# Patient Record
Sex: Female | Born: 1960
Health system: Southern US, Community
[De-identification: ages and names within clinical notes are randomized; demographics above are authoritative.]

## PROBLEM LIST (undated history)

## (undated) DIAGNOSIS — E039 Hypothyroidism, unspecified: Secondary | ICD-10-CM

## (undated) DIAGNOSIS — I10 Essential (primary) hypertension: Secondary | ICD-10-CM

## (undated) DIAGNOSIS — T7840XA Allergy, unspecified, initial encounter: Secondary | ICD-10-CM

## (undated) DIAGNOSIS — F419 Anxiety disorder, unspecified: Secondary | ICD-10-CM

## (undated) DIAGNOSIS — K219 Gastro-esophageal reflux disease without esophagitis: Secondary | ICD-10-CM

## (undated) DIAGNOSIS — M1990 Unspecified osteoarthritis, unspecified site: Secondary | ICD-10-CM

## (undated) DIAGNOSIS — G4730 Sleep apnea, unspecified: Secondary | ICD-10-CM

## (undated) HISTORY — DX: Essential (primary) hypertension: I10

## (undated) HISTORY — DX: Gastro-esophageal reflux disease without esophagitis: K21.9

## (undated) HISTORY — DX: Hypothyroidism, unspecified: E03.9

## (undated) HISTORY — DX: Unspecified osteoarthritis, unspecified site: M19.90

## (undated) HISTORY — DX: Sleep apnea, unspecified: G47.30

## (undated) HISTORY — DX: Anxiety disorder, unspecified: F41.9

## (undated) HISTORY — DX: Allergy, unspecified, initial encounter: T78.40XA

---

## 1998-03-21 ENCOUNTER — Other Ambulatory Visit: Admission: RE | Admit: 1998-03-21 | Discharge: 1998-03-21 | Payer: Self-pay | Admitting: Obstetrics and Gynecology

## 1998-04-09 ENCOUNTER — Encounter: Payer: Self-pay | Admitting: Obstetrics and Gynecology

## 1998-04-18 ENCOUNTER — Inpatient Hospital Stay (HOSPITAL_COMMUNITY): Admission: RE | Admit: 1998-04-18 | Discharge: 1998-04-20 | Payer: Self-pay | Admitting: Obstetrics and Gynecology

## 1999-01-20 HISTORY — PX: ABDOMINAL HYSTERECTOMY: SHX81

## 2000-05-06 ENCOUNTER — Other Ambulatory Visit: Admission: RE | Admit: 2000-05-06 | Discharge: 2000-05-06 | Payer: Self-pay | Admitting: Obstetrics and Gynecology

## 2002-06-12 ENCOUNTER — Other Ambulatory Visit: Admission: RE | Admit: 2002-06-12 | Discharge: 2002-06-12 | Payer: Self-pay | Admitting: Obstetrics and Gynecology

## 2003-07-24 ENCOUNTER — Other Ambulatory Visit: Admission: RE | Admit: 2003-07-24 | Discharge: 2003-07-24 | Payer: Self-pay | Admitting: Obstetrics and Gynecology

## 2005-08-06 ENCOUNTER — Encounter (INDEPENDENT_AMBULATORY_CARE_PROVIDER_SITE_OTHER): Payer: Self-pay | Admitting: Specialist

## 2005-08-06 ENCOUNTER — Ambulatory Visit (HOSPITAL_BASED_OUTPATIENT_CLINIC_OR_DEPARTMENT_OTHER): Admission: RE | Admit: 2005-08-06 | Discharge: 2005-08-06 | Payer: Self-pay | Admitting: General Surgery

## 2007-01-20 HISTORY — PX: BLADDER REPAIR: SHX76

## 2007-12-30 ENCOUNTER — Encounter: Admission: RE | Admit: 2007-12-30 | Discharge: 2007-12-30 | Payer: Self-pay | Admitting: Obstetrics and Gynecology

## 2008-01-02 ENCOUNTER — Encounter: Payer: Self-pay | Admitting: Internal Medicine

## 2008-01-02 LAB — CONVERTED CEMR LAB

## 2008-03-19 ENCOUNTER — Ambulatory Visit (HOSPITAL_COMMUNITY): Admission: RE | Admit: 2008-03-19 | Discharge: 2008-03-19 | Payer: Self-pay | Admitting: Obstetrics and Gynecology

## 2009-04-11 ENCOUNTER — Encounter: Payer: Self-pay | Admitting: Internal Medicine

## 2009-05-19 LAB — HM MAMMOGRAPHY: HM Mammogram: NORMAL

## 2009-05-19 LAB — CONVERTED CEMR LAB: Pap Smear: NORMAL

## 2009-07-18 ENCOUNTER — Encounter: Payer: Self-pay | Admitting: Internal Medicine

## 2009-08-01 ENCOUNTER — Encounter: Payer: Self-pay | Admitting: Internal Medicine

## 2009-08-01 DIAGNOSIS — I10 Essential (primary) hypertension: Secondary | ICD-10-CM | POA: Insufficient documentation

## 2009-08-01 DIAGNOSIS — E7800 Pure hypercholesterolemia, unspecified: Secondary | ICD-10-CM | POA: Insufficient documentation

## 2009-08-01 LAB — CONVERTED CEMR LAB
ALT: 17 units/L
AST: 15 units/L
Albumin: 4.6 g/dL
Alkaline Phosphatase: 80 units/L
BUN: 12 mg/dL
CO2: 22 meq/L
Calcium: 9.9 mg/dL
Cholesterol: 242 mg/dL
Creatinine, Ser: 0.93 mg/dL
Glucose, Urine, Semiquant: 102
HDL: 43 mg/dL
LDL Cholesterol: 161 mg/dL
Potassium: 4.3 meq/L
Sodium: 140 meq/L
TSH: 7.223 microintl units/mL
Total Bilirubin: 0.4 mg/dL
Total Protein: 7.3 g/dL
Triglyceride fasting, serum: 188 mg/dL

## 2009-08-07 ENCOUNTER — Ambulatory Visit: Payer: Self-pay | Admitting: Internal Medicine

## 2009-08-07 DIAGNOSIS — H16009 Unspecified corneal ulcer, unspecified eye: Secondary | ICD-10-CM | POA: Insufficient documentation

## 2009-09-02 ENCOUNTER — Telehealth: Payer: Self-pay | Admitting: Internal Medicine

## 2009-09-05 ENCOUNTER — Ambulatory Visit: Payer: Self-pay | Admitting: Internal Medicine

## 2009-10-23 ENCOUNTER — Ambulatory Visit: Payer: Self-pay | Admitting: Internal Medicine

## 2009-10-23 ENCOUNTER — Telehealth: Payer: Self-pay | Admitting: Internal Medicine

## 2010-01-08 ENCOUNTER — Ambulatory Visit: Payer: Self-pay | Admitting: Internal Medicine

## 2010-01-08 DIAGNOSIS — R519 Headache, unspecified: Secondary | ICD-10-CM | POA: Insufficient documentation

## 2010-01-08 DIAGNOSIS — E039 Hypothyroidism, unspecified: Secondary | ICD-10-CM | POA: Insufficient documentation

## 2010-01-08 DIAGNOSIS — R51 Headache: Secondary | ICD-10-CM | POA: Insufficient documentation

## 2010-01-08 LAB — CONVERTED CEMR LAB
ALT: 17 units/L (ref 0–35)
AST: 16 units/L (ref 0–37)
Albumin: 4.5 g/dL (ref 3.5–5.2)
Alkaline Phosphatase: 82 units/L (ref 39–117)
BUN: 11 mg/dL (ref 6–23)
Basophils Absolute: 0.1 10*3/uL (ref 0.0–0.1)
Basophils Relative: 1.1 % (ref 0.0–3.0)
Bilirubin, Direct: 0.1 mg/dL (ref 0.0–0.3)
CO2: 29 meq/L (ref 19–32)
CRP, High Sensitivity: 5.51 — ABNORMAL HIGH (ref 0.00–5.00)
Calcium: 9.9 mg/dL (ref 8.4–10.5)
Chloride: 99 meq/L (ref 96–112)
Creatinine, Ser: 0.8 mg/dL (ref 0.4–1.2)
Eosinophils Absolute: 0.2 10*3/uL (ref 0.0–0.7)
Eosinophils Relative: 1.8 % (ref 0.0–5.0)
GFR calc non Af Amer: 78.06 mL/min (ref >60.00)
Glucose, Bld: 80 mg/dL (ref 70–99)
HCT: 44.5 % (ref 36.0–46.0)
Hemoglobin: 15.4 g/dL — ABNORMAL HIGH (ref 12.0–15.0)
Lymphocytes Relative: 27.4 % (ref 12.0–46.0)
Lymphs Abs: 2.6 10*3/uL (ref 0.7–4.0)
MCHC: 34.5 g/dL (ref 30.0–36.0)
MCV: 90.1 fL (ref 78.0–100.0)
Monocytes Absolute: 0.7 10*3/uL (ref 0.1–1.0)
Monocytes Relative: 6.9 % (ref 3.0–12.0)
Neutro Abs: 6 10*3/uL (ref 1.4–7.7)
Neutrophils Relative %: 62.8 % (ref 43.0–77.0)
Platelets: 385 10*3/uL (ref 150.0–400.0)
Potassium: 3.8 meq/L (ref 3.5–5.1)
RBC: 4.95 M/uL (ref 3.87–5.11)
RDW: 13.1 % (ref 11.5–14.6)
Sed Rate: 10 mm/hr (ref 0–22)
Sodium: 138 meq/L (ref 135–145)
TSH: 5.17 microintl units/mL (ref 0.35–5.50)
Total Bilirubin: 0.6 mg/dL (ref 0.3–1.2)
Total Protein: 7.6 g/dL (ref 6.0–8.3)
WBC: 9.5 10*3/uL (ref 4.5–10.5)

## 2010-02-18 NOTE — Assessment & Plan Note (Signed)
Summary: 2 ND MMR Cynthia Craig Natale Milch  Nurse Visit   Allergies: 1)  ! Codeine  Immunizations Administered:  MMR Vaccine # 2:    Vaccine Type: MMR    Site: right deltoid    Mfr: Merck    Dose: 0.5 ml    Route: IM    Given by: Ami Bullins CMA    Exp. Date: 05/05/2010    Lot #: [id number]    VIS given: 04/01/06 version given October 23, 2009.  Orders Added: 1)  MMR Vaccine SQ [90707] 2)  Admin 1st Vaccine [[address]]

## 2010-02-18 NOTE — Progress Notes (Signed)
Summary: MMR Vaccine  Phone Note Call from Patient   Summary of Call: Pt left vm on triage. She need MMR series for school. Left vm to confirm with the school whether she needs titer (labs to confirm childhood vaccination) or vaccine at office. We have the MMR in stock and are able to give at nurse visit.  Initial call taken by: Lamar Sprinkles, CMA,  September 02, 2009 3:17 PM

## 2010-02-18 NOTE — Assessment & Plan Note (Signed)
Summary: MMR/ TLJ Cynthia Craig  Nurse Visit   Allergies: 1)  ! Codeine  Immunizations Administered:  MMR Vaccine # 1:    Vaccine Type: MMR    Site: left deltoid    Mfr: Merck    Dose: 0.5 ml    Route: IM    Given by: Lanier Prude, CMA(AAMA)    Exp. Date: 01/10/2011    Lot #: [id number]    VIS given: 04/01/06 version given September 05, 2009.  Orders Added: 1)  MMR Vaccine SQ [90707] 2)  Admin 1st Vaccine [04540]

## 2010-02-18 NOTE — Assessment & Plan Note (Signed)
Summary: new / bcbs / # cd   Vital Signs:  Patient profile:   50 year old female Height:      65 inches Weight:      190 pounds BMI:     31.73 O2 Sat:      97 % on Room air Temp:     98.8 degrees F oral Pulse rate:   96 / minute Pulse rhythm:   regular Resp:     16 per minute BP sitting:   122 / 86  (left arm) Cuff size:   large  Vitals Entered By: Rock Nephew CMA (August 07, 2009 3:03 PM)  Nutrition Counseling: Patient's BMI is greater than 25 and therefore counseled on weight management options.  O2 Flow:  Room air CC: New pt// discuss recent elevated BP, Red eye Is Patient Diabetic? No Pain Assessment Patient in pain? no        Primary Care Provider:  Etta Grandchild MD  CC:  New pt// discuss recent elevated BP and Red eye.  History of Present Illness:  Red Eye      This is a 50 year old woman who presents with Red eye.  The symptoms began 2 weeks ago.  The severity is described as mild.  The patient reports redness of the right eye, pain, photophobia, and discharge, but denies redness of the left eye, blurring of vision, loss of vision, swelling, foreign body sensation, and itching.  Risk factors for red eye include contact lens usage.  The patient denies the following risk factors: foreign body in eye, history of glaucoma, FH of glaucoma, recent chickenpox infection, prior chicken pox infection, recent eye surgery, PMH of inflammatory bowel disease, and PMH sarcoidosis.    Preventive Screening-Counseling & Management  Alcohol-Tobacco     Alcohol drinks/day: <1     Alcohol type: all     >5/day in last 3 mos: no     Alcohol Counseling: not indicated; use of alcohol is not excessive or problematic     Feels need to cut down: no     Feels annoyed by complaints: no     Feels guilty re: drinking: no     Needs 'eye opener' in am: no     Smoking Status: never  Caffeine-Diet-Exercise     Does Patient Exercise: yes     Type of exercise:  swimming  Hep-HIV-STD-Contraception     Hepatitis Risk: no risk noted     HIV Risk: no risk noted     STD Risk: no risk noted     SBE monthly: yes     SBE Education/Counseling: to perform regular SBE      Sexual History:  currently monogamous.        Drug Use:  never.        Blood Transfusions:  no.    Clinical Review Panels:  Prevention   Last Mammogram:  normal (05/19/2009)   Last Pap Smear:  normal (05/19/2009)  Lipid Management   Cholesterol:  242 (08/01/2009)   LDL (bad choesterol):  161 (08/01/2009)   HDL (good cholesterol):  43 (08/01/2009)   Triglycerides:  188 (08/01/2009)  Diabetes Management   Creatinine:  0.93 (08/01/2009)  Complete Metabolic Panel   Sodium:  140 (08/01/2009)   Potassium:  4.3 (08/01/2009)   CO2:  22 (08/01/2009)   BUN:  12 (08/01/2009)   Creatinine:  0.93 (08/01/2009)   Albumin:  4.6 (08/01/2009)   Total Protein:  7.3 (08/01/2009)   Calcium:  9.9 (08/01/2009)   Total Bili:  0.4 (08/01/2009)   Alk Phos:  80 (08/01/2009)   SGPT (ALT):  17 (08/01/2009)   SGOT (AST):  15 (08/01/2009)   Medications Prior to Update: 1)  Hydrochlorothiazide 25 Mg Tabs (Hydrochlorothiazide) 2)  Lipitor 10 Mg Tabs (Atorvastatin Calcium) .... 1/2 Once Daily  Current Medications (verified): 1)  Hydrochlorothiazide 25 Mg Tabs (Hydrochlorothiazide) .... One By Mouth Once Daily 2)  Lisinopril 5 Mg Tabs (Lisinopril) .... Take 1 Tablet By Mouth Once A Day 3)  Levothyroxine Sodium 50 Mcg Tabs (Levothyroxine Sodium) .... Take 1 Tablet By Mouth Once A Day 4)  Ciprofloxacin Hcl 0.3 % Soln (Ciprofloxacin Hcl) .... 2 Gtts Into Right Eye Qid  Allergies (verified): 1)  ! Codeine  Past History:  Past Medical History: Hypertension Hypothyroidism  Past Surgical History: Reviewed history from 08/01/2009 and no changes required. Hysterectomy- 2001 Caesarean section- 1995 Midurethral Sling- 2010  Family History: Reviewed history from 08/01/2009 and no changes  required. Family History of Asthma Family History Hypertension Family History Kidney disease Family History Thyroid disease Family History Diabetes  Social History: Reviewed history from 08/01/2009 and no changes required. 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  The patient denies chest pain, syncope, dyspnea on exertion, peripheral edema, prolonged cough, headaches, hemoptysis, abdominal pain, and suspicious skin lesions.    Physical Exam  General:  alert, well-developed, well-nourished, well-hydrated, appropriate dress, and overweight-appearing.   Eyes:  right conjunctival injection and excessive tearing with 1 small central ulcer over the corneal epithelium with no stellate lesions. left eye is normal. Ears:  R ear normal and L ear normal.   Mouth:  Oral mucosa and oropharynx without lesions or exudates.  Teeth in good repair. Neck:  supple, full ROM, no masses, no thyromegaly, no JVD, normal carotid upstroke, no carotid bruits, no cervical lymphadenopathy, and no neck tenderness.   Lungs:  normal respiratory effort, no intercostal retractions, no accessory muscle use, normal breath sounds, no dullness, no fremitus, no crackles, and no wheezes.   Heart:  normal rate, regular rhythm, no murmur, no gallop, no rub, no JVD, and no HJR.   Abdomen:  soft, non-tender, normal bowel sounds, no distention, no masses, no guarding, no rigidity, no rebound tenderness, no abdominal hernia, no inguinal hernia, no hepatomegaly, and no splenomegaly.   Msk:  normal ROM, no joint tenderness, no joint swelling, no joint warmth, no redness over joints, and no  joint deformities.   Pulses:  R and L carotid,radial,femoral,dorsalis pedis and posterior tibial pulses are full and equal bilaterally Extremities:  No clubbing, cyanosis, edema, or deformity noted with normal full range of motion of all joints.   Neurologic:  No cranial nerve deficits noted. Station and gait are normal. Plantar reflexes are down-going bilaterally. DTRs are symmetrical throughout. Sensory, motor and coordinative functions appear intact. Skin:  turgor normal, color normal, no rashes, no suspicious lesions, no ecchymoses, no petechiae, no purpura, no ulcerations, and no edema.   Cervical Nodes:  No lymphadenopathy noted Axillary Nodes:  No palpable lymphadenopathy Inguinal Nodes:  No significant adenopathy Psych:  Cognition and judgment appear intact. Alert  and cooperative with normal attention span and concentration. No apparent delusions, illusions, hallucinations   Impression & Recommendations:  Problem # 1:  UNSPECIFIED CORNEAL ULCER (ICD-370.00) Assessment New this is most likely staph from contact lens usage so I will start ciloxan drops  Problem # 2:  HYPERTENSION (ICD-401.9) Assessment: Improved  Her updated medication list for this problem includes:    Hydrochlorothiazide 25 Mg Tabs (Hydrochlorothiazide) ..... One by mouth once daily    Lisinopril 5 Mg Tabs (Lisinopril) .Marland Kitchen... Take 1 tablet by mouth once a day  BP today: 122/86  Prior 10 Yr Risk Heart Disease: Not enough information (08/01/2009)  Labs Reviewed: K+: 4.3 (08/01/2009) Creat: : 0.93 (08/01/2009)   Chol: 242 (08/01/2009)   HDL: 43 (08/01/2009)   LDL: 161 (08/01/2009)   TG: 188 (08/01/2009)  Complete Medication List: 1)  Hydrochlorothiazide 25 Mg Tabs (Hydrochlorothiazide) .... One by mouth once daily 2)  Lisinopril 5 Mg Tabs (Lisinopril) .... Take 1 tablet by mouth once a day 3)  Levothyroxine Sodium 50 Mcg Tabs (Levothyroxine sodium) .... Take 1 tablet by mouth once a day 4)  Ciprofloxacin Hcl  0.3 % Soln (Ciprofloxacin hcl) .... 2 gtts into right eye qid  Patient Instructions: 1)  Please schedule a follow-up appointment in 3 months. 2)  It is important that you exercise regularly at least 20 minutes 5 times a week. If you develop chest pain, have severe difficulty breathing, or feel very tired , stop exercising immediately and seek medical attention. 3)  You need to lose weight. Consider a lower calorie diet and regular exercise.  4)  Check your Blood Pressure regularly. If it is above 140/90: you should make an appointment. 5)  Clean any discharge from eyelids with baby shampoo and warm water. Be sure to wash your hands often  to avoid spreading and reinfection. If you wear contacts, remove them and wear glasses until infection resolved (be sure and clean lenses before replacing).  Prescriptions: LEVOTHYROXINE SODIUM 50 MCG TABS (LEVOTHYROXINE SODIUM) Take 1 tablet by mouth once a day  #30 x 11   Entered and Authorized by:   Etta Grandchild MD   Signed by:   Etta Grandchild MD on 08/07/2009   Method used:   Electronically to        [facility] # [phone]* (retail)       [address]       [place], [place]  [address]       Ph: [phone]       Fax: [phone]   RxID:   [id number] LISINOPRIL 5 MG TABS (LISINOPRIL) Take 1 tablet by mouth once a day  #30 x 11   Entered and Authorized by:   Etta Grandchild MD   Signed by:   Etta Grandchild MD on 08/07/2009   Method used:   Electronically to        [facility] # [phone]* (retail)       [address]       [place], [place]  [address]       Ph: [phone]       Fax: [phone]   RxID:   [id number] HYDROCHLOROTHIAZIDE 25 MG TABS (HYDROCHLOROTHIAZIDE) One by mouth once daily  #30 x 11   Entered and Authorized by:   Etta Grandchild MD   Signed by:   Etta Grandchild MD on 08/07/2009   Method used:   Electronically to        [facility] # [phone]* (retail)       [address]       [place], [place]   [address]       Ph: [phone]       Fax: [phone]   RxID:   [id number] CIPROFLOXACIN HCL 0.3 % SOLN (CIPROFLOXACIN HCL) 2 gtts into right eye QID  #1 bottle x 0   Entered and Authorized by:   Etta Grandchild MD   Signed by:   Etta Grandchild MD on 08/07/2009   Method used:   Electronically to        [facility] # [phone]* (retail)       [address]       [place], [place]  [address]       Ph: [phone]       Fax: [phone]   RxID:   [id number]   Preventive Care Screening  Mammogram:    Date:  05/19/2009    Results:  normal   Pap Smear:    Date:  05/19/2009    Results:  normal   Last Tetanus Booster:    Date:  01/20/2003    Results:  Historical

## 2010-02-18 NOTE — Letter (Signed)
[facility]   Imported By: Sherian Rein 08/09/2009 11:10:33  _____________________________________________________________________  External Attachment:    Type:   Image     Comment:   External Document

## 2010-02-18 NOTE — Letter (Signed)
Summary: [facility]   Imported By: Sherian Rein 08/09/2009 11:11:34  _____________________________________________________________________  External Attachment:    Type:   Image     Comment:   External Document

## 2010-02-20 NOTE — Assessment & Plan Note (Signed)
Summary: HEADACHES/NWS   Vital Signs:  Patient profile:   50 year old female Menstrual status:  hysterectomy Height:      65 inches Weight:      190 pounds BMI:     31.73 O2 Sat:      96 % on Room air Temp:     98.3 degrees F oral Pulse rate:   93 / minute Pulse rhythm:   regular Resp:     16 per minute BP sitting:   140 / 92  (left arm) Cuff size:   large  Vitals Entered By: Rock Nephew CMA (January 08, 2010 1:17 PM)  Nutrition Counseling: Patient's BMI is greater than 25 and therefore counseled on weight management options.  O2 Flow:  Room air CC: Patient c/o headache with dizziness , Headaches, Headache Pain Assessment Patient in pain? yes     Location: head  Does patient need assistance? Functional Status Self care Ambulation Normal     Menstrual Status hysterectomy Last PAP Result normal   Primary Care Provider:  Etta Grandchild MD  CC:  Patient c/o headache with dizziness , Headaches, and Headache.  History of Present Illness:  Headaches      This is a 50 year old woman who presents with Headaches.  The symptoms began 4-8 weeks ago.  On a scale of 1 to 10, the intensity is described as a 2.  The patient denies nausea, vomiting, sweats, tearing of eyes, nasal congestion, sinus pain, sinus pressure, photophobia, and phonophobia.  The headache is described as intermittent and sharp.  The location of the pain is occipital.  High-risk features (red flags) include new type of headache.  The patient denies the following high-risk features: fever, neck pain/stiffness, vision loss or change, focal weakness, altered mental status, rash, trauma, pain worse with exertion, age >50 years, immunosuppression, concomitant infection, and anticoagulation use.  The headaches are precipitated by stress.  Prior treatment has included a NSAID.    Headache HPI:      Duration of headaches: 4-8 weeks.        On a scale of 1-10, she describes the headache as a 2.  The location of the  headaches are occipital.  Headache quality is intermittent and sharp.         Preventive Screening-Counseling & Management  Alcohol-Tobacco     Alcohol drinks/day: <1     Alcohol type: all     >5/day in last 3 mos: no     Alcohol Counseling: not indicated; use of alcohol is not excessive or problematic     Feels need to cut down: no     Feels annoyed by complaints: no     Feels guilty re: drinking: no     Needs 'eye opener' in am: no     Smoking Status: never     Tobacco Counseling: not indicated; no tobacco use  Hep-HIV-STD-Contraception     Hepatitis Risk: no risk noted     HIV Risk: no risk noted     STD Risk: no risk noted     SBE monthly: yes     SBE Education/Counseling: to perform regular SBE      Sexual History:  currently monogamous.        Drug Use:  never.        Blood Transfusions:  no.    Current Medications (verified): 1)  Hydrochlorothiazide 25 Mg Tabs (Hydrochlorothiazide) .... One By Mouth Once Daily 2)  Lisinopril 5 Mg Tabs (Lisinopril) .Marland KitchenMarland KitchenMarland Kitchen  Take 1 Tablet By Mouth Once A Day 3)  Levothyroxine Sodium 50 Mcg Tabs (Levothyroxine Sodium) .... Take 1 Tablet By Mouth Once A Day 4)  Ciprofloxacin Hcl 0.3 % Soln (Ciprofloxacin Hcl) .... 2 Gtts Into Right Eye Qid  Allergies (verified): 1)  ! Codeine  Past History:  Past Medical History: Last updated: 08/07/2009 Hypertension Hypothyroidism  Past Surgical History: Last updated: 08/01/2009 Hysterectomy- 2001 Caesarean section- 1995 Midurethral Sling- 2010  Family History: Last updated: 08/01/2009 Family History of Asthma Family History Hypertension Family History Kidney disease Family History Thyroid disease Family History Diabetes  Social History: Last updated: 01/08/2010 Married Drug use-no Occupation: student Never Smoked Alcohol use-yes Regular exercise-yes  Risk Factors: Alcohol Use: <1 (01/08/2010) >5 drinks/d w/in last 3 months: no (01/08/2010) Exercise: yes (08/07/2009)  Risk  Factors: Smoking Status: never (01/08/2010)  Family History: Reviewed history from 08/01/2009 and no changes required. Family History of Asthma Family History Hypertension Family History Kidney disease Family History Thyroid disease Family History Diabetes  Social History: Reviewed history from 08/07/2009 and no changes required. 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  The patient denies anorexia, fever, weight loss, weight gain, vision loss, decreased hearing, hoarseness, chest pain, syncope, dyspnea on exertion, peripheral edema, prolonged cough, hemoptysis, abdominal pain, melena, hematuria, muscle weakness, suspicious skin lesions, transient blindness, difficulty walking, depression, unusual weight change, and abnormal bleeding.   Neuro:  Complains of headaches; denies brief paralysis, difficulty with concentration, disturbances in coordination, falling down, inability to speak, memory loss, numbness, poor balance, seizures, sensation of room spinning, tingling, tremors, visual disturbances, and weakness. Endo:  Denies cold intolerance, excessive hunger, excessive thirst, excessive urination, heat intolerance, polyuria, and weight change.  Physical Exam  General:  alert, well-developed, well-nourished, well-hydrated, appropriate dress, normal appearance, healthy-appearing, cooperative to examination, and good hygiene.   Head:  normocephalic, atraumatic, no abnormalities observed, no abnormalities palpated, and no alopecia.   Eyes:  vision grossly intact, pupils equal, pupils round, pupils reactive to light, pupils react to accomodation, no injection, no optic disk abnormalities, no retinal abnormalitiies, and no nystagmus.   Ears:  R ear normal and L ear normal.   Mouth:  Oral mucosa and oropharynx without lesions or exudates.  Teeth in good repair. Neck:  supple, full  ROM, and no masses.   Lungs:  normal respiratory effort, no intercostal retractions, no accessory muscle use, normal breath sounds, no dullness, no fremitus, no crackles, and no wheezes.   Heart:  normal rate, regular rhythm, no murmur, no gallop, no rub, and no JVD.   Abdomen:  soft, non-tender, normal bowel sounds, no distention, no masses, no guarding, no rigidity, no rebound tenderness, no abdominal hernia, no inguinal hernia, no hepatomegaly, and no splenomegaly.   Msk:  normal ROM, no joint tenderness, no joint swelling, no joint warmth, no redness over joints, no joint deformities, no joint instability, and no crepitation.   Pulses:  R and L carotid,radial,femoral,dorsalis pedis and posterior tibial pulses are full and equal bilaterally Extremities:  No clubbing, cyanosis, edema, or deformity noted with normal full range of motion of all joints.   Neurologic:  alert & oriented X3, cranial nerves II-XII intact, strength normal in all extremities, sensation intact to light touch, sensation intact to pinprick, gait normal, DTRs symmetrical and normal, finger-to-nose normal, heel-to-shin normal, toes down bilaterally on Babinski, and Romberg negative.     Impression &  Recommendations:  Problem # 1:  HYPOTHYROIDISM (ICD-244.9) Assessment Unchanged  Her updated medication list for this problem includes:    Levothyroxine Sodium 50 Mcg Tabs (Levothyroxine sodium) .Marland Kitchen... Take 1 tablet by mouth once a day  Labs Reviewed: TSH: 7.223 (08/01/2009)    Chol: 242 (08/01/2009)   HDL: 43 (08/01/2009)   LDL: 161 (08/01/2009)   TG: 188 (08/01/2009)  Problem # 2:  HEADACHE (ICD-784.0) Assessment: New this sounds like a tension-contraction type of headache, will check for vasculitis and other systemic illness. I may order a brain scan pending her response to Robaxin. Orders: Venipuncture ([address]) TLB-BMP (Basic Metabolic Panel-BMET) (80048-METABOL) TLB-CBC Platelet - w/Differential  (85025-CBCD) TLB-Hepatic/Liver Function Pnl (80076-HEPATIC) TLB-TSH (Thyroid Stimulating Hormone) (84443-TSH) TLB-CRP-High Sensitivity (C-Reactive Protein) (86140-FCRP) TLB-Sedimentation Rate (ESR) (85652-ESR)  Problem # 3:  HYPERTENSION (ICD-401.9) Assessment: Unchanged  Her updated medication list for this problem includes:    Hydrochlorothiazide 25 Mg Tabs (Hydrochlorothiazide) ..... One by mouth once daily    Lisinopril 5 Mg Tabs (Lisinopril) .Marland Kitchen... Take 1 tablet by mouth once a day  Orders: Venipuncture ([address]) TLB-BMP (Basic Metabolic Panel-BMET) (80048-METABOL) TLB-CBC Platelet - w/Differential (85025-CBCD) TLB-Hepatic/Liver Function Pnl (80076-HEPATIC) TLB-TSH (Thyroid Stimulating Hormone) (84443-TSH) TLB-CRP-High Sensitivity (C-Reactive Protein) (86140-FCRP) TLB-Sedimentation Rate (ESR) (85652-ESR)  BP today: 140/92 Prior BP: 122/86 (08/07/2009)  Prior 10 Yr Risk Heart Disease: Not enough information (08/01/2009)  Labs Reviewed: K+: 4.3 (08/01/2009) Creat: : 0.93 (08/01/2009)   Chol: 242 (08/01/2009)   HDL: 43 (08/01/2009)   LDL: 161 (08/01/2009)   TG: 188 (08/01/2009)  Complete Medication List: 1)  Hydrochlorothiazide 25 Mg Tabs (Hydrochlorothiazide) .... One by mouth once daily 2)  Lisinopril 5 Mg Tabs (Lisinopril) .... Take 1 tablet by mouth once a day 3)  Levothyroxine Sodium 50 Mcg Tabs (Levothyroxine sodium) .... Take 1 tablet by mouth once a day 4)  Methocarbamol 500 Mg Tabs (Methocarbamol) .... One by mouth three times a day as needed for tension headache  Patient Instructions: 1)  Please schedule a follow-up appointment in 2 weeks. 2)  It is important that you exercise regularly at least 20 minutes 5 times a week. If you develop chest pain, have severe difficulty breathing, or feel very tired , stop exercising immediately and seek medical attention. 3)  You need to lose weight. Consider a lower calorie diet and regular exercise.  4)  Check your Blood Pressure  regularly. If it is above 130/80: you should make an appointment. Prescriptions: METHOCARBAMOL 500 MG TABS (METHOCARBAMOL) One by mouth three times a day as needed for tension headache  #50 x 2   Entered and Authorized by:   Etta Grandchild MD   Signed by:   Etta Grandchild MD on 01/08/2010   Method used:   Electronically to        [facility] # [phone]* (retail)       [address]       [place], [place]  [address]       Ph: [phone]       Fax: [phone]   RxID:   [id number]    Orders Added: 1)  Venipuncture [66440] 2)  Est. Patient Level IV [34742] 3)  TLB-BMP (Basic Metabolic Panel-BMET) [80048-METABOL] 4)  TLB-CBC Platelet - w/Differential [85025-CBCD] 5)  TLB-Hepatic/Liver Function Pnl [80076-HEPATIC] 6)  TLB-TSH (Thyroid Stimulating Hormone) [84443-TSH] 7)  TLB-CRP-High Sensitivity (C-Reactive Protein) [86140-FCRP] 8)  TLB-Sedimentation Rate (ESR) [59563-OVF]

## 2010-04-30 ENCOUNTER — Ambulatory Visit (INDEPENDENT_AMBULATORY_CARE_PROVIDER_SITE_OTHER)
Admission: RE | Admit: 2010-04-30 | Discharge: 2010-04-30 | Disposition: A | Discharge status: Home / Self Care | Payer: 59 | Source: Ambulatory Visit | Attending: Internal Medicine | Admitting: Internal Medicine

## 2010-04-30 ENCOUNTER — Ambulatory Visit (INDEPENDENT_AMBULATORY_CARE_PROVIDER_SITE_OTHER): Payer: 59 | Admitting: Internal Medicine

## 2010-04-30 ENCOUNTER — Encounter: Payer: Self-pay | Admitting: Internal Medicine

## 2010-04-30 VITALS — BP 138/88 | HR 90 | Temp 98.3°F | Resp 14 | Ht 65.0 in | Wt 184.8 lb

## 2010-04-30 DIAGNOSIS — M25561 Pain in right knee: Secondary | ICD-10-CM

## 2010-04-30 DIAGNOSIS — IMO0002 Reserved for concepts with insufficient information to code with codable children: Secondary | ICD-10-CM

## 2010-04-30 DIAGNOSIS — M25569 Pain in unspecified knee: Secondary | ICD-10-CM

## 2010-04-30 DIAGNOSIS — M179 Osteoarthritis of knee, unspecified: Secondary | ICD-10-CM | POA: Insufficient documentation

## 2010-04-30 DIAGNOSIS — M1710 Unilateral primary osteoarthritis, unspecified knee: Secondary | ICD-10-CM | POA: Insufficient documentation

## 2010-04-30 MED ORDER — IBUPROFEN 600 MG PO TABS: 600.0000 mg | ORAL_TABLET | Freq: Four times a day (QID) | ORAL | Status: DC | PRN

## 2010-04-30 NOTE — Patient Instructions (Signed)
Arthritis - Degenerative, Osteoarthritis You have osteoarthritis. This is the wear and tear arthritis that comes with aging. It is also called degenerative arthritis. This is common in people past middle age. It is caused by stress on the joints from living. The large weight bearing joints of the lower extremities are most often affected. The knees, hips, back, neck, and hands can become painful, swollen, and stiff. This is the most common type of arthritis. It comes on with age, carrying too much weight, and from injury. Treatment includes resting the sore joint until the pain and swelling improve. Crutches or a walker may be needed for severe flares. Only take over-the-counter or prescription medicines for pain, discomfort, or fever as directed by your caregiver. Local heat therapy may improve motion. Cortisone shots into the joint are sometimes used to reduce pain and swelling during flares. Osteoarthritis is usually not crippling and progresses slowly. There are things you can do to decrease pain:  Avoid high impact activities.   Exercise regularly.   Low impact exercises such as walking, biking and swimming help to keep the muscles strong and keep normal joint function.   Stretching helps to keep your range of motion.   Lose weight if you are overweight. This reduces joint stress.  In severe cases when you have pain at rest or increasing disability, joint surgery may be helpful. See your caregiver for follow-up treatment as recommended.  SEEK IMMEDIATE MEDICAL CARE IF:  You have severe joint pain.   Marked swelling and redness in your joint develops.   You develop a high fever.  Document Released: 01/05/2005 Document Re-Released: 05/07/2007 [employer] Patient Information 2011 [employer]. 

## 2010-04-30 NOTE — Progress Notes (Signed)
Subjective:    Cynthia Craig is a 50 y.o. female who presents with knee pain involving the right knee. Onset was gradual, starting about 2 weeks ago. Inciting event: none known. Current symptoms include: crepitus sensation, locking, stiffness and swelling. Pain is aggravated by any weight bearing and going up and down stairs. Patient has had no prior knee problems. Evaluation to date: none. Treatment to date: OTC analgesics which are somewhat effective.  The following portions of the patient's history were reviewed and updated as appropriate: allergies, current medications, past family history, past medical history, past social history, past surgical history and problem list.   Review of Systems Musculoskeletal:positive for knee pain, negative for arthralgias, back pain, muscle weakness, myalgias, neck pain and stiff joints   Objective:    BP 138/88  Pulse 90  Temp(Src) 98.3 F (36.8 C) (Oral)  Resp 14  Ht 5\' 5"  (1.651 m)  Wt 184 lb 12 oz (83.802 kg)  BMI 30.74 kg/m2  SpO2 96% Right knee: positive exam findings: effusion, crepitus, medial joint line tenderness and lateral joint line tenderness and negative exam findings: no erythema, ACL stable, PCL stable, MCL stable, LCL stable, no patellar laxity, McMurray's negative and FROM  Left knee:  normal and no effusion, full active range of motion, no joint line tenderness, ligamentous structures intact.   X-ray right knee: shows DJD changes, likely chronic    Assessment:    Right knee mild DJD   Plan:    Natural history and expected course discussed. Questions answered. Transport planner distributed. Rest, ice, compression, and elevation (RICE) therapy. Reduction in offending activity. Quad strengthening exercises. NSAIDs per medication orders. PT referral. Follow up in 1 month.

## 2010-05-06 LAB — BASIC METABOLIC PANEL
BUN: 9 mg/dL (ref 6–23)
Calcium: 9.3 mg/dL (ref 8.4–10.5)
Chloride: 100 mEq/L (ref 96–112)
Creatinine, Ser: 0.89 mg/dL (ref 0.4–1.2)
GFR calc Af Amer: >60 mL/min (ref >60)
GFR calc non Af Amer: >60 mL/min (ref >60)

## 2010-05-06 LAB — CBC
HCT: 41.9 % (ref 36.0–46.0)
MCHC: 33.6 g/dL (ref 30.0–36.0)
MCV: 90.2 fL (ref 78.0–100.0)
Platelets: 384 10*3/uL (ref 150–400)
RBC: 4.64 MIL/uL (ref 3.87–5.11)

## 2010-06-03 NOTE — H&P (Signed)
NAME:  Cynthia Craig, AYE NO.:  [id number]   MEDICAL RECORD NO.:  [id number]          PATIENT TYPE:  AMB   LOCATION:                                FACILITY:  [facility]   PHYSICIAN:  Juluis Mire, M.D.   DATE OF BIRTH:  1960-12-13   DATE OF ADMISSION:  03/19/2008  DATE OF DISCHARGE:                              HISTORY & PHYSICAL    The patient is a 50 year old gravida 2, para 1, abortus 1 female who  presents for mid-urethral sling.   In relation to the present admission, the patient is having worsening  problems with stress urinary incontinence.  She leaks during events such  as coughing, sneezing, and exercises.  This is becoming a consistent  problem and is somewhat limiting.  She underwent urodynamic testing in  the office.  She had a normal postvoid residual.  Her volumes were  fairly normal.  She did leak with coughing and Valsalva with normal leak-  point pressures.  She had normal urethral pressure profile.  Her voiding  pattern did not show an obstructive pattern.  There was no overactive  bladder activity noted.  She now presents for a mid-urethral sling,  other alternatives such as biofeedback and bladder retraining were  discussed.   In terms of allergies, she is allergic to CODEINE.   MEDICATIONS:  1. Cozaar were 100 mg daily.  2. Levoxyl 150 mcg daily.  3. Maxzide 1 daily.   PAST MEDICAL HISTORY:  Significant for history of hypertension, she is  on medications as noted.  Also history of hypothyroidism on Synthroid  replacement.   PREVIOUS SURGICAL HISTORY:  She has had a previous laparoscopic-assisted  vaginal hysterectomy in 2000.  She had a cesarean section.   SOCIAL HISTORY:  Reveals no tobacco or alcohol abuse.   FAMILY HISTORY:  Noncontributory.   REVIEW OF SYSTEMS:  Noncontributory.   PHYSICAL EXAMINATION:  VITAL SIGNS:  The patient is afebrile with stable  vital signs.  HEENT:  Head is normocephalic.  Pupils are equal, round, and  reactive to  light and accommodation.  Extraocular movements were intact.  Sclerae  and conjunctivae are clear.  Oropharynx is clear.  NECK:  Without thyromegaly.  BREASTS:  Not examined.  LUNGS:  Clear.  CARDIAC:  Regular rate without murmurs or gallops.  ABDOMEN:  Benign.  No mass, organomegaly, or tenderness.  PELVIC:  Normal external genitalia.  Vaginal mucosa is clear.  Cuff  intact.  She has a mild cystourethrocele.  Bimanual exam is  unremarkable.  No mass appreciated.  EXTREMITIES:  Trace edema.  NEUROLOGIC:  Grossly within normal limits.   IMPRESSION:  1. Anatomical stress urinary incontinence due to urethral      hypermobility.  2. Hypertension.  3. Hypothyroidism.   PLAN:  The patient will go for a transobturator midurethral sling.  Success rate of 8% are quoted.  The risk has been explained to the  patient, this would include the risk of over tightening.  This could  lead to inability to void.  This could require taking down the sling  with return of incontinence.  There is a risk of developing overactive  bladder activity that could require long-term medications.  Also the  risk of mesh erosion was explained this could require surgical  management.  Risk of injury to adjacent organs, this could include  bladder, urethra.  This could require further surgical management.  The  risk of operative nerve injury could lead to chronic leg pain and  weakness.  There is also the risk of hemorrhage that could require  transfusion with a risk of AIDS or hepatitis.  Risk of deep venous  thrombosis and pulmonary embolus.  The patient expressed understanding  of indications and risks.      Juluis Mire, M.D.  Electronically Signed     JSM/MEDQ  D:  03/19/2008  T:  03/19/2008  Job:  [id number]

## 2010-06-03 NOTE — Op Note (Signed)
Cynthia Craig, Cynthia Craig                ACCOUNT NO.:  [id number]   MEDICAL RECORD NO.:  [id number]          PATIENT TYPE:  AMB   LOCATION:  [facility]                           FACILITY:  [facility]   PHYSICIAN:  Juluis Mire, M.D.   DATE OF BIRTH:  Oct 13, 1960   DATE OF PROCEDURE:  03/19/2008  DATE OF DISCHARGE:                               OPERATIVE REPORT   PREOPERATIVE DIAGNOSIS:  Anatomical stress urinary incontinence due to  urethral hypermobility.   POSTOPERATIVE DIAGNOSIS:  Anatomical stress urinary incontinence due to  urethral hypermobility.   PROCEDURES:  1. Mid urethral sling using a transobturator approach.  2. Cystoscopy.   SURGEON:  Juluis Mire, MD   ANESTHESIA:  General.   ESTIMATED BLOOD LOSS:  200 mL.   PACKS AND DRAINS:  None.   INTRAOPERATIVE BLOOD PLACED:  None.   COMPLICATIONS:  None.   INDICATIONS:  Dictated in the history and physical.   PROCEDURE IN DETAIL:  The patient was taken to the OR and placed in  supine position.  After satisfactory level of general anesthesia was  obtained, the patient was placed in the dorsal lithotomy position using  the Allen stirrups.  The perineum and vagina cleansed out with Betadine  and draped in sterile field.  A weighted speculum was placed in the  vaginal vault.  Foley was put in place and clamped off.  The mid  urethral area was identified, injected with 1% Xylocaine with  epinephrine.  Incision was made in the vaginal mucosa over the mid  urethral area.  We dissected out laterally to the obturator foramen on  each side.  At this point in time, in the groin, we identified an area  at the level of clitoris below the abductus longus muscle and tendon.  Next to the inferior pubic ramus,  this area was infiltrated with 1%  Xylocaine with epinephrine.  A punch incision was made in the skin using  the halo system needles were passed through the skin on each side  through the obturator foramen around the inferior pubic ramus  and out  through the vaginal incision.  There was no buttonhole in the vagina  noted.  Cystoscopy was then performed.  There was no evidence of any  injury to the bladder.  Both ureteral orifices were identified, noted to  be spilling copious amounts of clear urine.  Urethra was also intact.  Next, the polypropylene mesh was put in place, attached the needles on  each side and brought out through the skin, it was adjusted around the  mid urethral area until lay flat, yet we could easily rotate a Kelly 90  degrees.  The plastic sheaths were then removed.  We reconfirmed proper  placement of the polypropylene mesh that was overly tightened.  The arms  were trimmed at the level of the skin in the groin and the groin  incisions were closed with Dermabond.  At this point in time, vaginal  mucosa was closed with running suture of 2-0 Vicryl.  Some oozing was  encountered and brought to control with figure-of-eight of  2-0 Vicryl.  At this point  in time, Foley was placed back straight drain.  Urine was clear and  adequate.  The patient was taken out of the dorsal position, once alert  and extubated, transferred to recovery room in good condition.  Sponge,  instrument, and needle count reported as correct by circulating nurse.      Juluis Mire, M.D.  Electronically Signed     JSM/MEDQ  D:  03/19/2008  T:  03/19/2008  Job:  [id number]

## 2010-06-06 NOTE — Op Note (Signed)
NAMETERRIYAH, WESTRA                ACCOUNT NO.:  [id number]   MEDICAL RECORD NO.:  [id number]          PATIENT TYPE:  AMB   LOCATION:  [facility]                          FACILITY:  [facility]   PHYSICIAN:  Leonie Man, M.D.   DATE OF BIRTH:  [birth year]   DATE OF PROCEDURE:  08/06/2005  DATE OF DISCHARGE:                                 OPERATIVE REPORT   PREOPERATIVE DIAGNOSIS:  Melanoma left flank.   POSTOPERATIVE DIAGNOSIS:  Melanoma left flank.   PROCEDURE:  Excision melanoma left flank.   SURGEON:  Leonie Man, M.D.   ASSISTANT:  None.   ANESTHESIA:  1% lidocaine with epinephrine.   NOTE:  Ms. Downie is a 50 year old female with a melanotic lesion which has  been excised by Dr. Campbell Stall.  The lesion has undetermined biologic  potential.  The patient comes to the operating room now for a wider excision  of this area after the risks and potential benefits of surgery have been  discussed, all questions answered and consent obtained.   PROCEDURE:  With the patient positioned with the left flank up on the  operating room table, the area of the previously excised melanoma was  prepped and draped to be included in a sterile operative field.  I outlined  an elliptical incision around this leaving approximately 2 mm margins around  the area.  This was then infiltrated with 1% lidocaine with epinephrine.  An  elliptical incision is carried down through the skin down through the  subcutaneous tissues.  The area is excised in its entirety.  The superior or  12 o'clock margin was marked with a nylon suture and this specimen was  forwarded for pathologic evaluation.  Hemostasis obtained with  electrocautery.  Subcutaneous tissues reapproximated with 3-0 Vicryl sutures  and the skin was closed with running 4-0 Monocryl suture and then reinforced  with Steri-Strips.  Sterile dressings applied.  Anesthetic reversed.  The  patient removed from the operating room to the recovery room in  stable  condition.  She tolerated the procedure well.      Leonie Man, M.D.  Electronically Signed     PB/MEDQ  D:  08/06/2005  T:  08/06/2005  Job:  [id number]

## 2010-08-05 ENCOUNTER — Ambulatory Visit: Payer: 59 | Admitting: *Deleted

## 2010-08-05 DIAGNOSIS — Z111 Encounter for screening for respiratory tuberculosis: Secondary | ICD-10-CM

## 2010-08-07 ENCOUNTER — Encounter: Payer: Self-pay | Admitting: Internal Medicine

## 2010-08-07 LAB — TB SKIN TEST: TB Skin Test: NEGATIVE mm

## 2010-11-14 ENCOUNTER — Encounter: Payer: Self-pay | Admitting: Internal Medicine

## 2010-11-14 ENCOUNTER — Ambulatory Visit (INDEPENDENT_AMBULATORY_CARE_PROVIDER_SITE_OTHER): Payer: 59 | Admitting: Internal Medicine

## 2010-11-14 ENCOUNTER — Other Ambulatory Visit (INDEPENDENT_AMBULATORY_CARE_PROVIDER_SITE_OTHER): Payer: 59

## 2010-11-14 VITALS — BP 172/100 | HR 90 | Temp 98.3°F | Resp 16 | Wt 194.0 lb

## 2010-11-14 DIAGNOSIS — I10 Essential (primary) hypertension: Secondary | ICD-10-CM

## 2010-11-14 DIAGNOSIS — R0989 Other specified symptoms and signs involving the circulatory and respiratory systems: Secondary | ICD-10-CM

## 2010-11-14 DIAGNOSIS — R0683 Snoring: Secondary | ICD-10-CM | POA: Insufficient documentation

## 2010-11-14 DIAGNOSIS — R0609 Other forms of dyspnea: Secondary | ICD-10-CM

## 2010-11-14 DIAGNOSIS — R51 Headache: Secondary | ICD-10-CM

## 2010-11-14 DIAGNOSIS — E039 Hypothyroidism, unspecified: Secondary | ICD-10-CM

## 2010-11-14 DIAGNOSIS — Z23 Encounter for immunization: Secondary | ICD-10-CM

## 2010-11-14 LAB — TSH: TSH: 5.71 u[IU]/mL — ABNORMAL HIGH (ref 0.35–5.50)

## 2010-11-14 LAB — CBC WITH DIFFERENTIAL/PLATELET
Basophils Absolute: 0 10*3/uL (ref 0.0–0.1)
Basophils Relative: 0.5 % (ref 0.0–3.0)
Eosinophils Absolute: 0.2 10*3/uL (ref 0.0–0.7)
Hemoglobin: 14.4 g/dL (ref 12.0–15.0)
Lymphocytes Relative: 27 % (ref 12.0–46.0)
MCHC: 34.3 g/dL (ref 30.0–36.0)
MCV: 88.9 fl (ref 78.0–100.0)
Monocytes Absolute: 0.4 10*3/uL (ref 0.1–1.0)
Neutro Abs: 5.6 10*3/uL (ref 1.4–7.7)
RDW: 13.2 % (ref 11.5–14.6)

## 2010-11-14 LAB — COMPREHENSIVE METABOLIC PANEL
AST: 18 U/L (ref 0–37)
Alkaline Phosphatase: 73 U/L (ref 39–117)
BUN: 11 mg/dL (ref 6–23)
Creatinine, Ser: 0.9 mg/dL (ref 0.4–1.2)
Glucose, Bld: 100 mg/dL — ABNORMAL HIGH (ref 70–99)
Potassium: 3.8 mEq/L (ref 3.5–5.1)
Total Bilirubin: 0.5 mg/dL (ref 0.3–1.2)

## 2010-11-14 LAB — SEDIMENTATION RATE: Sed Rate: 8 mm/hr (ref 0–22)

## 2010-11-14 MED ORDER — OLMESARTAN MEDOXOMIL 40 MG PO TABS: 40.0000 mg | ORAL_TABLET | Freq: Every day | ORAL | Status: DC

## 2010-11-14 MED ORDER — METOPROLOL-HCTZ ER 25-12.5 MG PO TB24: 1.0000 | ORAL_TABLET | Freq: Every day | ORAL | Status: DC

## 2010-11-14 NOTE — Assessment & Plan Note (Signed)
I will check her TSH today 

## 2010-11-14 NOTE — Patient Instructions (Signed)

## 2010-11-14 NOTE — Progress Notes (Signed)
Subjective:    Patient ID: Cynthia Craig, female    DOB: October 14, 1960, 50 y.o.   MRN: [id number]  Hypertension This is a chronic problem. The current episode started more than 1 year ago. The problem has been gradually worsening since onset. The problem is uncontrolled. Associated symptoms include blurred vision and headaches. Pertinent negatives include no anxiety, chest pain, malaise/fatigue, neck pain, orthopnea, palpitations, peripheral edema, shortness of breath or sweats. Agents associated with hypertension include decongestants (sudafed). Past treatments include ACE inhibitors and diuretics. The current treatment provides no improvement. Compliance problems include exercise and diet.       Review of Systems  Constitutional: Negative for fever, chills, malaise/fatigue, diaphoresis, activity change, appetite change, fatigue and unexpected weight change.  HENT: Negative for neck pain.   Eyes: Positive for blurred vision and visual disturbance (BV). Negative for photophobia, pain, discharge, redness and itching.  Respiratory: Positive for apnea (heavy snoring). Negative for cough, choking, chest tightness, shortness of breath, wheezing and stridor.   Cardiovascular: Negative for chest pain, palpitations, orthopnea and leg swelling.  Gastrointestinal: Negative for nausea, vomiting, abdominal pain, diarrhea, constipation and blood in stool.  Genitourinary: Negative for dysuria, urgency, frequency, hematuria, flank pain, decreased urine volume, enuresis, difficulty urinating and dyspareunia.  Musculoskeletal: Negative for myalgias, back pain, joint swelling, arthralgias and gait problem.  Skin: Negative for color change, pallor, rash and wound.  Neurological: Positive for headaches. Negative for dizziness, tremors, seizures, syncope, facial asymmetry, speech difficulty, weakness, light-headedness and numbness.  Hematological: Negative for adenopathy. Does not bruise/bleed easily.    Psychiatric/Behavioral: Positive for sleep disturbance (frequent awakenings). Negative for suicidal ideas, hallucinations, behavioral problems, confusion, self-injury, dysphoric mood, decreased concentration and agitation. The patient is not nervous/anxious and is not hyperactive.        Objective:   Physical Exam  Vitals reviewed. Constitutional: She appears well-developed and well-nourished. No distress.  HENT:  Head: Normocephalic and atraumatic.  Mouth/Throat: Oropharynx is clear and moist. No oropharyngeal exudate.  Eyes: Conjunctivae and EOM are normal. Pupils are equal, round, and reactive to light. Right eye exhibits no discharge. Left eye exhibits no discharge. No scleral icterus.  Neck: Normal range of motion. Neck supple. No JVD present. No tracheal deviation present. No thyromegaly present.  Cardiovascular: Normal rate, regular rhythm, normal heart sounds and intact distal pulses.  Exam reveals no gallop and no friction rub.   No murmur heard. Pulmonary/Chest: Effort normal and breath sounds normal. No stridor. No respiratory distress. She has no wheezes. She has no rales. She exhibits no tenderness.  Abdominal: Soft. Bowel sounds are normal. She exhibits no distension and no mass. There is no tenderness. There is no rebound and no guarding.  Musculoskeletal: Normal range of motion. She exhibits no edema and no tenderness.  Lymphadenopathy:    She has no cervical adenopathy.  Neurological: She is alert. She has normal reflexes. She displays normal reflexes. No cranial nerve deficit. She exhibits normal muscle tone. Coordination normal.  Skin: Skin is warm and dry. No rash noted. She is not diaphoretic. No erythema. No pallor.  Psychiatric: She has a normal mood and affect. Her behavior is normal. Judgment and thought content normal.      Lab Results  Component Value Date   WBC 9.5 01/08/2010   HGB 15.4* 01/08/2010   HCT 44.5 01/08/2010   PLT 385.0 01/08/2010   GLUCOSE  80 01/08/2010   CHOL 242 08/01/2009   HDL 43 08/01/2009   LDLCALC 161 08/01/2009   ALT  17 01/08/2010   AST 16 01/08/2010   NA 138 01/08/2010   K 3.8 01/08/2010   CL 99 01/08/2010   CREATININE 0.8 01/08/2010   BUN 11 01/08/2010   CO2 29 01/08/2010   TSH 5.17 01/08/2010      Assessment & Plan:

## 2010-11-14 NOTE — Assessment & Plan Note (Signed)
I think the HA is caused by poor BP control, I will check labs and a sed rate today to look for secondary causes

## 2010-11-14 NOTE — Assessment & Plan Note (Signed)
She appears to have sleep apnea so I have referred her to sleep med for evaluation

## 2010-11-14 NOTE — Assessment & Plan Note (Signed)
Her BP is not controlled so I have made wholesale changes in her meds to lower the BP, also today I will check her labs to look for secondary causes and end organ damage, she will return for any new or worsening symptoms

## 2010-11-16 ENCOUNTER — Encounter: Payer: Self-pay | Admitting: Internal Medicine

## 2010-12-05 ENCOUNTER — Institutional Professional Consult (permissible substitution): Payer: 59 | Admitting: Emergency Medicine

## 2010-12-26 ENCOUNTER — Institutional Professional Consult (permissible substitution): Payer: 59 | Admitting: Pulmonary Disease

## 2011-05-04 ENCOUNTER — Other Ambulatory Visit: Payer: Self-pay | Admitting: Internal Medicine

## 2011-07-06 ENCOUNTER — Other Ambulatory Visit: Payer: Self-pay | Admitting: *Deleted

## 2011-07-06 DIAGNOSIS — I10 Essential (primary) hypertension: Secondary | ICD-10-CM

## 2011-07-06 MED ORDER — METOPROLOL-HCTZ ER 25-12.5 MG PO TB24: 1.0000 | ORAL_TABLET | Freq: Every day | ORAL | Status: DC

## 2011-07-06 MED ORDER — OLMESARTAN MEDOXOMIL 40 MG PO TABS: 40.0000 mg | ORAL_TABLET | Freq: Every day | ORAL | Status: DC

## 2011-07-06 NOTE — Telephone Encounter (Signed)
Pt called stating that she needs refills of BP meds sent to [facility] has previously received samples but needs rx sent to pharmacy. Rx's sent, pt informed.

## 2011-07-07 ENCOUNTER — Other Ambulatory Visit (HOSPITAL_COMMUNITY): Payer: Self-pay | Admitting: Obstetrics and Gynecology

## 2011-07-07 DIAGNOSIS — R1011 Right upper quadrant pain: Secondary | ICD-10-CM

## 2011-07-10 ENCOUNTER — Ambulatory Visit (HOSPITAL_COMMUNITY)
Admission: RE | Admit: 2011-07-10 | Discharge: 2011-07-10 | Disposition: A | Discharge status: Home / Self Care | Payer: 59 | Source: Ambulatory Visit | Attending: Obstetrics and Gynecology | Admitting: Obstetrics and Gynecology

## 2011-07-10 DIAGNOSIS — K8020 Calculus of gallbladder without cholecystitis without obstruction: Secondary | ICD-10-CM | POA: Insufficient documentation

## 2011-07-10 DIAGNOSIS — R1011 Right upper quadrant pain: Secondary | ICD-10-CM

## 2011-07-10 DIAGNOSIS — K7689 Other specified diseases of liver: Secondary | ICD-10-CM | POA: Insufficient documentation

## 2011-07-20 HISTORY — PX: CHOLECYSTECTOMY: SHX55

## 2011-08-05 ENCOUNTER — Telehealth: Payer: Self-pay

## 2011-08-05 DIAGNOSIS — I10 Essential (primary) hypertension: Secondary | ICD-10-CM

## 2011-08-05 MED ORDER — CARVEDILOL 6.25 MG PO TABS: 6.2500 mg | ORAL_TABLET | Freq: Two times a day (BID) | ORAL | Status: DC

## 2011-08-05 MED ORDER — LOSARTAN POTASSIUM-HCTZ 100-12.5 MG PO TABS: 1.0000 | ORAL_TABLET | Freq: Every day | ORAL | Status: DC

## 2011-08-05 NOTE — Telephone Encounter (Signed)
Pt called stating that she cannot afford Benicar and Dutoprol. Pt is requesting cheaper alternatives, please advise.

## 2011-08-05 NOTE — Telephone Encounter (Signed)
Changes made she can go to the pharmacy and get the generics, please ask her to come on in the next 1-2 months for a BP check with me

## 2011-08-06 NOTE — Telephone Encounter (Signed)
Patient notified and will pick up rx

## 2011-08-11 ENCOUNTER — Encounter (INDEPENDENT_AMBULATORY_CARE_PROVIDER_SITE_OTHER): Payer: Self-pay | Admitting: Surgery

## 2011-08-11 ENCOUNTER — Ambulatory Visit (INDEPENDENT_AMBULATORY_CARE_PROVIDER_SITE_OTHER): Payer: 59 | Admitting: Surgery

## 2011-08-11 VITALS — BP 150/90 | HR 80 | Temp 98.0°F | Resp 16 | Ht 65.0 in | Wt 193.4 lb

## 2011-08-11 DIAGNOSIS — K8010 Calculus of gallbladder with chronic cholecystitis without obstruction: Secondary | ICD-10-CM

## 2011-08-11 DIAGNOSIS — R1310 Dysphagia, unspecified: Secondary | ICD-10-CM

## 2011-08-11 DIAGNOSIS — R12 Heartburn: Secondary | ICD-10-CM

## 2011-08-11 NOTE — Progress Notes (Signed)
Subjective:     Patient ID: Cynthia Craig, female   DOB: [birth year], 51 y.o.   MRN: [id number]  HPI  Cynthia Craig  1960/06/25 [id number]  Patient Care Team: Etta Grandchild, MD as PCP - General Juluis Mire, MD as Consulting Physician (Obstetrics and Gynecology)  This patient is a 51 y.o.female who presents today for surgical evaluation at the request of Dr. Arelia Sneddon.   Reason for evaluation: Right upper quadrant and back pain with nausea.  Gallstones.  Probable biliary etiology.  Pleasant obese female who's had about a three month history of intermittent abdominal pains.  SB underneath her right rib cage and goes around her back.  She often will feel nausea with it.  She had one episode where she vomited.  Usually worse with eating.  Heavier foods tend to trigger it.  She's tried to switch to low-fat foods and more fruits and vegetables.  She still has gotten some attacks.  Hoffman's better she lies down still limited fade away.  She gets some mild dysphagia to some solid foods and that happens about every other month.  Not severe.  Not worsening.  Some mild heartburn for which is controlled with an over-the-counter acid reducer 1-2 times a week.  She has 2-3 loose bowel movements a day.  No change in Defecation.   Patient Active Problem List  Diagnosis  . HYPERCHOLESTEROLEMIA  . HYPERTENSION  . HYPOTHYROIDISM  . HEADACHE  . OA (osteoarthritis) of knee  . Snoring  . Need for prophylactic vaccination and inoculation against influenza    Past Medical History  Diagnosis Date  . Hypertension   . Hypothyroidism   . Arthritis     rt knee    Past Surgical History  Procedure Date  . Cesarean section 1995  . Abdominal hysterectomy 2001  . Bladder repair 2009    mesh / prolapsed bladder    History   Social History  . Marital Status: Married    Spouse Name: N/A    Number of Children: N/A  . Years of Education: N/A   Occupational History  . Student    Social History Main  Topics  . Smoking status: Never Smoker   . Smokeless tobacco: Never Used  . Alcohol Use: No  . Drug Use: No  . Sexually Active: Yes   Other Topics Concern  . Not on file   Social History Narrative   Regular Exercise -  YES    Family History  Problem Relation Age of Onset  . Thyroid disease Other   . Diabetes Mother   . Heart disease Mother     congestive heart failure  . Asthma Mother   . Hypertension Mother   . Kidney disease Mother   . Cancer Maternal Grandmother 80    stomach  . Cancer Maternal Grandfather 70    lung    Current Outpatient Prescriptions  Medication Sig Dispense Refill  . carvedilol (COREG) 6.25 MG tablet Take 1 tablet (6.25 mg total) by mouth 2 (two) times daily with a meal.  180 tablet  3  . ibuprofen (ADVIL,MOTRIN) 600 MG tablet TAKE 1 TABLET BY MOUTH EVERY 6 HOURS AS NEEDED FOR PAIN  75 tablet  10  . losartan-hydrochlorothiazide (HYZAAR) 100-12.5 MG per tablet Take 1 tablet by mouth daily.  90 tablet  3  . methocarbamol (ROBAXIN) 500 MG tablet Take 500 mg by mouth 3 (three) times daily as needed.        Marland Kitchen levothyroxine (  SYNTHROID, LEVOTHROID) 50 MCG tablet Take 50 mcg by mouth daily.           Allergies  Allergen Reactions  . Codeine Nausea And Vomiting    BP 150/90  Pulse 80  Temp 98 F (36.7 C) (Temporal)  Resp 16  Ht 5\' 5"  (1.651 m)  Wt 193 lb 6.4 oz (87.726 kg)  BMI 32.18 kg/m2  No results found.   Review of Systems  Constitutional: Negative for fever, chills, diaphoresis, appetite change and fatigue.  HENT: Negative for ear pain, sore throat, trouble swallowing, neck pain and ear discharge.   Eyes: Negative for photophobia, discharge and visual disturbance.  Respiratory: Negative for cough, choking, chest tightness and shortness of breath.   Cardiovascular: Negative for chest pain and palpitations.       Patient walks 30 minutes for about 1 miles without difficulty.  No exertional chest/neck/shoulder/arm pain.  Gastrointestinal:  Positive for nausea, vomiting and abdominal pain. Negative for diarrhea, constipation, anal bleeding and rectal pain.       No personal nor family history of GI/colon cancer, inflammatory bowel disease, irritable bowel syndrome, allergy such as Celiac Sprue, dietary/dairy problems, colitis, ulcers nor gastritis.    No recent sick contacts/gastroenteritis.  No travel outside the country.  No changes in diet.   Genitourinary: Negative for dysuria, frequency and difficulty urinating.  Musculoskeletal: Positive for back pain. Negative for myalgias and gait problem.  Skin: Negative for color change, pallor and rash.  Neurological: Negative for dizziness, speech difficulty, weakness and numbness.  Hematological: Negative for adenopathy.  Psychiatric/Behavioral: Negative for confusion and agitation. The patient is not nervous/anxious.        Objective:   Physical Exam  Constitutional: She is oriented to person, place, and time. She appears well-developed and well-nourished. No distress.  HENT:  Head: Normocephalic.  Mouth/Throat: Oropharynx is clear and moist. No oropharyngeal exudate.  Eyes: Conjunctivae and EOM are normal. Pupils are equal, round, and reactive to light. No scleral icterus.  Neck: Normal range of motion. Neck supple. No tracheal deviation present.  Cardiovascular: Normal rate, regular rhythm and intact distal pulses.   Pulmonary/Chest: Effort normal and breath sounds normal. No respiratory distress. She exhibits no tenderness.  Abdominal: Soft. She exhibits no distension and no mass. There is no hepatosplenomegaly. There is tenderness in the right upper quadrant. There is CVA tenderness. There is no guarding, no tenderness at McBurney's point and negative Murphy's sign. No hernia. Hernia confirmed negative in the right inguinal area and confirmed negative in the left inguinal area.  Genitourinary: No vaginal discharge found.  Musculoskeletal: Normal range of motion. She exhibits  no tenderness.  Lymphadenopathy:    She has no cervical adenopathy.       Right: No inguinal adenopathy present.       Left: No inguinal adenopathy present.  Neurological: She is alert and oriented to person, place, and time. No cranial nerve deficit. She exhibits normal muscle tone. Coordination normal.  Skin: Skin is warm and dry. No rash noted. She is not diaphoretic. No erythema.  Psychiatric: She has a normal mood and affect. Her behavior is normal. Judgment and thought content normal.       Assessment:     Biliary colic with more constant pain c/w chronic cholecystitis    Plan:     Lap chole.  Try single site:  The anatomy & physiology of hepatobiliary & pancreatic function was discussed.  The pathophysiology of gallbladder dysfunction was discussed.  Natural history  risks without surgery was discussed.   I feel the risks of no intervention will lead to serious problems that outweigh the operative risks; therefore, I recommended cholecystectomy to remove the pathology.  I explained laparoscopic techniques with possible need for an open approach.  Probable cholangiogram to evaluate the bilary tract was explained as well.    Risks such as bleeding, infection, abscess, leak, injury to other organs, need for further treatment, heart attack, death, and other risks were discussed.  I noted a good likelihood this will help address the problem.  Possibility that this will not correct all abdominal symptoms was explained.  Goals of post-operative recovery were discussed as well.  We will work to minimize complications.  An educational handout further explaining the pathology and treatment options was given as well.  Questions were answered.  The patient expresses understanding & wishes to proceed with surgery.  She does have some mild dysphasia and reflux stable for years.  She may benefit from a gastroenterology evaluation for that at some point.  However it does not change the fact that she  does have biliary colic.  She agrees and wishes to proceed with surgery first.  Screening colonoscopy at age 86

## 2011-08-11 NOTE — Patient Instructions (Addendum)
See the Handout(s) we gave you.  Consider surgery.  Please call our office at [phone] if you wish to schedule surgery or if you have further questions / concerns.   Cholelithiasis Cholelithiasis (also called gallstones) is a form of gallbladder disease where gallstones form in your gallbladder. The gallbladder is a non-essential organ that stores bile made in the liver, which helps digest fats. Gallstones begin as small crystals and slowly grow into stones. Gallstone pain occurs when the gallbladder spasms, and a gallstone is blocking the duct. Pain can also occur when a stone passes out of the duct.  Women are more likely to develop gallstones than men. Other factors that increase the risk of gallbladder disease are:  Having multiple pregnancies. Physicians sometimes advise removing diseased gallbladders before future pregnancies.   Obesity.   Diets heavy in fried foods and fat.   Increasing age (older than 60).   Prolonged use of medications containing female hormones.   Diabetes mellitus.   Rapid weight loss.   Family history of gallstones (heredity).  SYMPTOMS  Feeling sick to your stomach (nauseous).   Abdominal pain.   Yellowing of the skin (jaundice).   Sudden pain. It may persist from several minutes to several hours.   Worsening pain with deep breathing or when jarred.   Fever.   Tenderness to the touch.  In some cases, when gallstones do not move into the bile duct, people have no pain or symptoms. These are called "silent" gallstones. TREATMENT In severe cases, emergency surgery may be required. HOME CARE INSTRUCTIONS   Only take over-the-counter or prescription medicines for pain, discomfort, or fever as directed by your caregiver.   Follow a low-fat diet until seen again. Fat causes the gallbladder to contract, which can result in pain.   Follow up as instructed. Attacks are almost always recurrent and surgery is usually required for permanent  treatment.  SEEK IMMEDIATE MEDICAL CARE IF:   Your pain increases and is not controlled by medications.   You have an oral temperature above 102 F (38.9 C), not controlled by medication.   You develop nausea and vomiting.  MAKE SURE YOU:   Understand these instructions.   Will watch your condition.   Will get help right away if you are not doing well or get worse.  Document Released: 01/01/2005 Document Revised: 12/25/2010 Document Reviewed: 03/06/2010 ExitCare Patient Information 2012 [employer]. 

## 2011-08-13 ENCOUNTER — Other Ambulatory Visit (INDEPENDENT_AMBULATORY_CARE_PROVIDER_SITE_OTHER): Payer: Self-pay | Admitting: Surgery

## 2011-08-13 DIAGNOSIS — K8010 Calculus of gallbladder with chronic cholecystitis without obstruction: Secondary | ICD-10-CM

## 2011-08-26 ENCOUNTER — Telehealth (INDEPENDENT_AMBULATORY_CARE_PROVIDER_SITE_OTHER): Payer: Self-pay | Admitting: General Surgery

## 2011-08-26 NOTE — Telephone Encounter (Signed)
Pt called in to see if post op set for 09/23/11 can be moved due to change in surgery date. Pt stated no problems at incision site, no discharge or signs of infection. Call placed on hold and per [facility] patient needs to keep the appointment set, as of this time there are no available slots available for the patient to be seen. Confirmed with patient, she agreed to keep appointment.

## 2011-09-23 ENCOUNTER — Encounter (INDEPENDENT_AMBULATORY_CARE_PROVIDER_SITE_OTHER): Payer: 59 | Admitting: Surgery

## 2011-09-28 ENCOUNTER — Encounter (INDEPENDENT_AMBULATORY_CARE_PROVIDER_SITE_OTHER): Payer: 59 | Admitting: Surgery

## 2012-04-05 ENCOUNTER — Encounter: Payer: Self-pay | Admitting: Internal Medicine

## 2012-04-05 ENCOUNTER — Ambulatory Visit (INDEPENDENT_AMBULATORY_CARE_PROVIDER_SITE_OTHER): Payer: 59 | Admitting: Internal Medicine

## 2012-04-05 ENCOUNTER — Other Ambulatory Visit (INDEPENDENT_AMBULATORY_CARE_PROVIDER_SITE_OTHER): Payer: 59

## 2012-04-05 VITALS — BP 140/90 | HR 95 | Temp 98.6°F | Resp 16 | Wt 194.5 lb

## 2012-04-05 DIAGNOSIS — G4762 Sleep related leg cramps: Secondary | ICD-10-CM | POA: Insufficient documentation

## 2012-04-05 DIAGNOSIS — E039 Hypothyroidism, unspecified: Secondary | ICD-10-CM

## 2012-04-05 DIAGNOSIS — E7800 Pure hypercholesterolemia, unspecified: Secondary | ICD-10-CM

## 2012-04-05 DIAGNOSIS — I10 Essential (primary) hypertension: Secondary | ICD-10-CM

## 2012-04-05 LAB — COMPREHENSIVE METABOLIC PANEL
AST: 14 U/L (ref 0–37)
Albumin: 4 g/dL (ref 3.5–5.2)
Alkaline Phosphatase: 73 U/L (ref 39–117)
Glucose, Bld: 113 mg/dL — ABNORMAL HIGH (ref 70–99)
Potassium: 4 mEq/L (ref 3.5–5.1)
Sodium: 133 mEq/L — ABNORMAL LOW (ref 135–145)
Total Bilirubin: 0.6 mg/dL (ref 0.3–1.2)
Total Protein: 7.4 g/dL (ref 6.0–8.3)

## 2012-04-05 LAB — CBC WITH DIFFERENTIAL/PLATELET
Basophils Relative: 1.5 % (ref 0.0–3.0)
Eosinophils Absolute: 0.1 10*3/uL (ref 0.0–0.7)
Eosinophils Relative: 0.6 % (ref 0.0–5.0)
HCT: 45.8 % (ref 36.0–46.0)
Lymphs Abs: 2.2 10*3/uL (ref 0.7–4.0)
MCHC: 33.8 g/dL (ref 30.0–36.0)
MCV: 88.5 fl (ref 78.0–100.0)
Monocytes Absolute: 0.6 10*3/uL (ref 0.1–1.0)
Neutrophils Relative %: 74 % (ref 43.0–77.0)
Platelets: 389 10*3/uL (ref 150.0–400.0)
WBC: 11.7 10*3/uL — ABNORMAL HIGH (ref 4.5–10.5)

## 2012-04-05 LAB — CK: Total CK: 65 U/L (ref 7–177)

## 2012-04-05 LAB — MAGNESIUM: Magnesium: 2 mg/dL (ref 1.5–2.5)

## 2012-04-05 LAB — TSH: TSH: 3.65 u[IU]/mL (ref 0.35–5.50)

## 2012-04-05 MED ORDER — LOSARTAN POTASSIUM 100 MG PO TABS: 100.0000 mg | ORAL_TABLET | Freq: Every day | ORAL | Status: DC

## 2012-04-05 NOTE — Patient Instructions (Signed)
Leg Cramps Leg cramps that occur during exercise can be caused by poor circulation or dehydration. However, muscle cramps that occur at rest or during the night are usually not due to any serious medical problem. Heat cramps may cause muscle spasms during hot weather.  CAUSES There is no clear cause for muscle cramps. However, dehydration may be a factor for those who do not drink enough fluids and those who exercise in the heat. Imbalances in the level of sodium, potassium, calcium or magnesium in the muscle tissue may also be a factor. Some medications, such as water pills (diuretics), may cause loss of chemicals that the body needs (like sodium and potassium) and cause muscle cramps. TREATMENT   Make sure your diet has enough fluids and essential minerals for the muscle to work normally.  Avoid strenuous exercise for several days if you have been having frequent leg cramps.  Stretch and massage the cramped muscle for several minutes.  Some medicines may be helpful in some patients with night cramps. Only take over-the-counter or prescription medicines as directed by your caregiver. SEEK IMMEDIATE MEDICAL CARE IF:   Your leg cramps become worse.  Your foot becomes cold, numb, or blue. Document Released: 02/13/2004 Document Revised: 03/30/2011 Document Reviewed: 01/31/2008 ExitCare Patient Information 2013 [employer].  

## 2012-04-05 NOTE — Progress Notes (Signed)
Subjective:    Patient ID: Cynthia Craig, female    DOB: October 29, 1960, 52 y.o.   MRN: [id number]  Hypertension This is a chronic problem. The current episode started more than 1 year ago. The problem is controlled. Pertinent negatives include no anxiety, blurred vision, chest pain, headaches, malaise/fatigue, neck pain, orthopnea, palpitations, peripheral edema, PND, shortness of breath or sweats. Past treatments include diuretics, beta blockers and angiotensin blockers. Compliance problems include exercise and diet.  Hypertensive end-organ damage includes a thyroid problem.      Review of Systems  Constitutional: Negative.  Negative for fever, chills, malaise/fatigue, diaphoresis, activity change, appetite change, fatigue and unexpected weight change.  HENT: Negative.  Negative for neck pain.   Eyes: Negative.  Negative for blurred vision.  Respiratory: Negative for cough, chest tightness, shortness of breath, wheezing and stridor.   Cardiovascular: Negative for chest pain, palpitations, orthopnea, leg swelling and PND.  Gastrointestinal: Negative for nausea, vomiting, abdominal pain, diarrhea, constipation and blood in stool.  Endocrine: Negative.   Genitourinary: Negative.   Musculoskeletal: Positive for myalgias (cramps in legs and feet at night). Negative for back pain, joint swelling, arthralgias and gait problem.  Skin: Negative for color change, pallor, rash and wound.  Allergic/Immunologic: Negative.   Neurological: Negative.  Negative for dizziness, tremors, seizures, syncope, speech difficulty, weakness, light-headedness, numbness and headaches.  Hematological: Negative.  Negative for adenopathy. Does not bruise/bleed easily.  Psychiatric/Behavioral: Negative.        Objective:   Physical Exam  Vitals reviewed. Constitutional: She is oriented to person, place, and time. She appears well-developed and well-nourished. No distress.  HENT:  Head: Normocephalic and atraumatic.   Mouth/Throat: Oropharynx is clear and moist. No oropharyngeal exudate.  Eyes: Conjunctivae are normal. Right eye exhibits no discharge. Left eye exhibits no discharge. No scleral icterus.  Neck: Normal range of motion. Neck supple. No JVD present. No tracheal deviation present. No thyromegaly present.  Cardiovascular: Normal rate, regular rhythm, S1 normal, S2 normal, normal heart sounds, intact distal pulses and normal pulses.  Exam reveals no gallop and no friction rub.   No murmur heard. Pulses:      Carotid pulses are 2+ on the right side, and 2+ on the left side.      Radial pulses are 2+ on the right side, and 2+ on the left side.       Femoral pulses are 2+ on the right side, and 2+ on the left side.      Popliteal pulses are 2+ on the right side, and 2+ on the left side.       Dorsalis pedis pulses are 2+ on the right side, and 2+ on the left side.       Posterior tibial pulses are 2+ on the right side, and 2+ on the left side.  Pulmonary/Chest: Effort normal and breath sounds normal. No stridor. No respiratory distress. She has no wheezes. She has no rales. She exhibits no tenderness.  Abdominal: Soft. Bowel sounds are normal. She exhibits no distension and no mass. There is no tenderness. There is no rebound and no guarding.  Musculoskeletal: Normal range of motion. She exhibits no edema and no tenderness.  Lymphadenopathy:    She has no cervical adenopathy.  Neurological: She is oriented to person, place, and time.  Skin: Skin is warm and dry. No rash noted. She is not diaphoretic. No erythema. No pallor.  Psychiatric: She has a normal mood and affect. Her behavior is normal. Judgment and  thought content normal.     Lab Results  Component Value Date   WBC 8.5 11/14/2010   HGB 14.4 11/14/2010   HCT 42.0 11/14/2010   PLT 364.0 11/14/2010   GLUCOSE 100* 11/14/2010   CHOL 242 08/01/2009   HDL 43 08/01/2009   LDLCALC 161 08/01/2009   ALT 17 11/14/2010   AST 18 11/14/2010   NA  140 11/14/2010   K 3.8 11/14/2010   CL 105 11/14/2010   CREATININE 0.9 11/14/2010   BUN 11 11/14/2010   CO2 27 11/14/2010   TSH 5.71* 11/14/2010   HGBA1C 5.8 11/14/2010       Assessment & Plan:

## 2012-04-06 ENCOUNTER — Encounter: Payer: Self-pay | Admitting: Internal Medicine

## 2012-04-06 NOTE — Assessment & Plan Note (Signed)
I will check her CPK level today

## 2012-04-06 NOTE — Assessment & Plan Note (Signed)
Labs today show that her Na+ and Cl+ are low, also she has leg cramps, so I have asked her to stop the HCTZ for now She will stay on losartan

## 2012-04-06 NOTE — Assessment & Plan Note (Signed)
Stop HCTZ

## 2012-04-06 NOTE — Assessment & Plan Note (Signed)
I will check her TSH today and will adjust her dose if needed 

## 2012-06-23 ENCOUNTER — Other Ambulatory Visit: Payer: Self-pay | Admitting: Internal Medicine

## 2012-10-25 ENCOUNTER — Encounter: Payer: Self-pay | Admitting: Gastroenterology

## 2012-10-25 ENCOUNTER — Other Ambulatory Visit (HOSPITAL_COMMUNITY): Payer: Self-pay | Admitting: Obstetrics and Gynecology

## 2012-10-25 DIAGNOSIS — E041 Nontoxic single thyroid nodule: Secondary | ICD-10-CM

## 2012-10-27 ENCOUNTER — Other Ambulatory Visit (HOSPITAL_COMMUNITY): Payer: 59

## 2012-11-03 ENCOUNTER — Ambulatory Visit (HOSPITAL_COMMUNITY)
Admission: RE | Admit: 2012-11-03 | Discharge: 2012-11-03 | Disposition: A | Discharge status: Home / Self Care | Payer: 59 | Source: Ambulatory Visit | Attending: Obstetrics and Gynecology | Admitting: Obstetrics and Gynecology

## 2012-11-03 DIAGNOSIS — E041 Nontoxic single thyroid nodule: Secondary | ICD-10-CM | POA: Insufficient documentation

## 2012-11-24 ENCOUNTER — Other Ambulatory Visit: Payer: Self-pay

## 2012-11-28 ENCOUNTER — Ambulatory Visit (AMBULATORY_SURGERY_CENTER): Payer: Self-pay

## 2012-11-28 VITALS — Ht 65.0 in | Wt 194.0 lb

## 2012-11-28 DIAGNOSIS — Z1211 Encounter for screening for malignant neoplasm of colon: Secondary | ICD-10-CM

## 2012-11-28 MED ORDER — MOVIPREP 100 G PO SOLR: 1.0000 | Freq: Once | ORAL | Status: DC

## 2012-11-29 ENCOUNTER — Encounter: Payer: Self-pay | Admitting: Gastroenterology

## 2012-12-27 ENCOUNTER — Other Ambulatory Visit: Payer: 59 | Admitting: Gastroenterology

## 2013-01-02 ENCOUNTER — Other Ambulatory Visit: Payer: 59 | Admitting: Gastroenterology

## 2013-01-07 ENCOUNTER — Other Ambulatory Visit: Payer: Self-pay | Admitting: Internal Medicine

## 2013-02-17 ENCOUNTER — Ambulatory Visit (AMBULATORY_SURGERY_CENTER): Payer: 59 | Admitting: Gastroenterology

## 2013-02-17 ENCOUNTER — Encounter: Payer: Self-pay | Admitting: Gastroenterology

## 2013-02-17 VITALS — BP 153/83 | HR 90 | Temp 98.6°F | Resp 16 | Ht 65.0 in | Wt 194.0 lb

## 2013-02-17 DIAGNOSIS — Z1211 Encounter for screening for malignant neoplasm of colon: Secondary | ICD-10-CM

## 2013-02-17 DIAGNOSIS — D126 Benign neoplasm of colon, unspecified: Secondary | ICD-10-CM

## 2013-02-17 MED ORDER — SODIUM CHLORIDE 0.9 % IV SOLN: 500.0000 mL | INTRAVENOUS | Status: DC

## 2013-02-17 NOTE — Patient Instructions (Signed)
Discharge instructions given with verbal understanding. Handout on polyps. Resume previous medications. YOU HAD AN ENDOSCOPIC PROCEDURE TODAY AT THE [facility]: Refer to the procedure report that was given to you for any specific questions about what was found during the examination.  If the procedure report does not answer your questions, please call your gastroenterologist to clarify.  If you requested that your care partner not be given the details of your procedure findings, then the procedure report has been included in a sealed envelope for you to review at your convenience later.  YOU SHOULD EXPECT: Some feelings of bloating in the abdomen. Passage of more gas than usual.  Walking can help get rid of the air that was put into your GI tract during the procedure and reduce the bloating. If you had a lower endoscopy (such as a colonoscopy or flexible sigmoidoscopy) you may notice spotting of blood in your stool or on the toilet paper. If you underwent a bowel prep for your procedure, then you may not have a normal bowel movement for a few days.  DIET: Your first meal following the procedure should be a light meal and then it is ok to progress to your normal diet.  A half-sandwich or bowl of soup is an example of a good first meal.  Heavy or fried foods are harder to digest and may make you feel nauseous or bloated.  Likewise meals heavy in dairy and vegetables can cause extra gas to form and this can also increase the bloating.  Drink plenty of fluids but you should avoid alcoholic beverages for 24 hours.  ACTIVITY: Your care partner should take you home directly after the procedure.  You should plan to take it easy, moving slowly for the rest of the day.  You can resume normal activity the day after the procedure however you should NOT DRIVE or use heavy machinery for 24 hours (because of the sedation medicines used during the test).    SYMPTOMS TO REPORT IMMEDIATELY: A  gastroenterologist can be reached at any hour.  During normal business hours, 8:30 AM to 5:00 PM Monday through Friday, call [phone].  After hours and on weekends, please call the GI answering service at [phone] who will take a message and have the physician on call contact you.   Following lower endoscopy (colonoscopy or flexible sigmoidoscopy):  Excessive amounts of blood in the stool  Significant tenderness or worsening of abdominal pains  Swelling of the abdomen that is new, acute  Fever of 100F or higher  FOLLOW UP: If any biopsies were taken you will be contacted by phone or by letter within the next 1-3 weeks.  Call your gastroenterologist if you have not heard about the biopsies in 3 weeks.  Our staff will call the home number listed on your records the next business day following your procedure to check on you and address any questions or concerns that you may have at that time regarding the information given to you following your procedure. This is a courtesy call and so if there is no answer at the home number and we have not heard from you through the emergency physician on call, we will assume that you have returned to your regular daily activities without incident.  SIGNATURES/CONFIDENTIALITY: You and/or your care partner have signed paperwork which will be entered into your electronic medical record.  These signatures attest to the fact that that the information above on your After Visit Summary has been   reviewed and is understood.  Full responsibility of the confidentiality of this discharge information lies with you and/or your care-partner. 

## 2013-02-17 NOTE — Op Note (Signed)
[place]  [address]. [place], [address]   COLONOSCOPY PROCEDURE REPORT  PATIENT: Cynthia Craig, Cynthia Craig  MR#: [id number] BIRTHDATE: Apr 03, 1960 , 52  yrs. old GENDER: Female ENDOSCOPIST: Milus Banister, MD REFERRED EL:[id number] Evalina Field, M.D. PROCEDURE DATE:  02/17/2013 PROCEDURE:   Colonoscopy with snare polypectomy First Screening Colonoscopy - Avg.  risk and is 50 yrs.  old or older Yes.  Prior Negative Screening - Now for repeat screening. N/A  History of Adenoma - Now for follow-up colonoscopy & has been > or = to 3 yrs.  N/A  Polyps Removed Today? Yes. ASA CLASS:   Class II INDICATIONS:average risk screening. MEDICATIONS: Fentanyl 75 mcg IV, Versed 7 mg IV, and These medications were titrated to patient response per physician's verbal order  DESCRIPTION OF PROCEDURE:   After the risks benefits and alternatives of the procedure were thoroughly explained, informed consent was obtained.  A digital rectal exam revealed no abnormalities of the rectum.   The [id number]  endoscope was introduced through the anus and advanced to the cecum, which was identified by both the appendix and ileocecal valve. No adverse events experienced.   The quality of the prep was excellent.  The instrument was then slowly withdrawn as the colon was fully examined.  COLON FINDINGS: One polyp was found, removed and sent to pathology. This was sessile, 85mm across, located in ascending segment, removed with cold snare.  The examination was otherwise normal. Retroflexed views revealed no abnormalities. The time to cecum=3 minutes 43 seconds.  Withdrawal time=13 minutes 11 seconds.  The scope was withdrawn and the procedure completed. COMPLICATIONS: There were no complications.  ENDOSCOPIC IMPRESSION: One polyp was found, removed and sent to pathology. The examination was otherwise normal.  RECOMMENDATIONS: If the polyp(s) removed today are proven to be  adenomatous (pre-cancerous) polyps, you will need a repeat colonoscopy in 5 years.  Otherwise you should continue to follow colorectal cancer screening guidelines for "routine risk" patients with colonoscopy in 10 years.  You will receive a letter within 1-2 weeks with the results of your biopsy as well as final recommendations.  Please call my office if you have not received a letter after 3 weeks.   eSigned:  Milus Banister, MD 02/17/2013 10:17 AM

## 2013-02-20 ENCOUNTER — Telehealth: Payer: Self-pay | Admitting: *Deleted

## 2013-02-20 ENCOUNTER — Other Ambulatory Visit: Payer: 59 | Admitting: Gastroenterology

## 2013-02-20 NOTE — Telephone Encounter (Signed)
No answer, left message to call if question or concerns. 

## 2013-02-23 ENCOUNTER — Encounter: Payer: Self-pay | Admitting: Gastroenterology

## 2013-02-23 ENCOUNTER — Telehealth: Payer: Self-pay | Admitting: Gastroenterology

## 2013-02-23 LAB — HM COLONOSCOPY

## 2013-02-23 NOTE — Telephone Encounter (Signed)
Patient notified of path results

## 2013-04-05 ENCOUNTER — Other Ambulatory Visit: Payer: Self-pay | Admitting: Internal Medicine

## 2013-04-06 ENCOUNTER — Other Ambulatory Visit: Payer: Self-pay | Admitting: Internal Medicine

## 2013-04-11 ENCOUNTER — Encounter: Payer: Self-pay | Admitting: Cardiovascular Disease

## 2013-04-11 ENCOUNTER — Ambulatory Visit (INDEPENDENT_AMBULATORY_CARE_PROVIDER_SITE_OTHER): Payer: 59 | Admitting: Cardiovascular Disease

## 2013-04-11 VITALS — BP 160/118 | HR 91 | Ht 65.0 in | Wt 193.8 lb

## 2013-04-11 DIAGNOSIS — I10 Essential (primary) hypertension: Secondary | ICD-10-CM

## 2013-04-11 DIAGNOSIS — Z79899 Other long term (current) drug therapy: Secondary | ICD-10-CM

## 2013-04-11 LAB — BASIC METABOLIC PANEL
BUN: 16 mg/dL (ref 6–23)
CHLORIDE: 93 meq/L — AB (ref 96–112)
CO2: 31 mEq/L (ref 19–32)
CREATININE: 1.1 mg/dL (ref 0.4–1.2)
Calcium: 9.6 mg/dL (ref 8.4–10.5)
GFR: 58.08 mL/min — ABNORMAL LOW (ref >60.00)
Glucose, Bld: 102 mg/dL — ABNORMAL HIGH (ref 70–99)
Potassium: 3 mEq/L — ABNORMAL LOW (ref 3.5–5.1)
Sodium: 134 mEq/L — ABNORMAL LOW (ref 135–145)

## 2013-04-11 LAB — TSH: TSH: 2.82 u[IU]/mL (ref 0.35–5.50)

## 2013-04-11 MED ORDER — POTASSIUM CHLORIDE CRYS ER 20 MEQ PO TBCR: 20.0000 meq | EXTENDED_RELEASE_TABLET | Freq: Every day | ORAL | Status: DC

## 2013-04-11 MED ORDER — NEBIVOLOL HCL 10 MG PO TABS: 10.0000 mg | ORAL_TABLET | Freq: Every day | ORAL | Status: DC

## 2013-04-11 MED ORDER — LOSARTAN POTASSIUM 100 MG PO TABS: 100.0000 mg | ORAL_TABLET | Freq: Every day | ORAL | Status: DC

## 2013-04-11 NOTE — Assessment & Plan Note (Addendum)
Cynthia Craig presents today for further evaluation and management of her hypertension. She's had hypertension for 20 years.  Well controlled in the past on HCTZ and losartan. She lost about 20 pounds and she was able to stop the HCTZ. She now has regained the 20 pounds and is back on HCTZ but her blood pressure control is not adequate.  She also is not on a potassium supplement.  She still admits to eating some salty foods-can foods, bacon, occasional soup. We'll have her reduce her salt intake.  I will add  potassium chloride 20 mEq a day. We will add a beta blocker-I would prefer Bystolic 10 mg a day. If this is too expensive, we will start her on carvedilol 6.25 mg twice a day and titrate up as tolerated/as needed.  I've recommended that she exercise on a daily basis. I've recommended that she work on a good weight loss program. We'll check a basic metabolic profile today as well as a TSH.   We'll recheck her basic metabolic profile in 3 weeks. I'll see her again in 3 months for followup visit.  Addendum;  Her labs today showed a k of 3.0  .  We have already ordered kdur 20 meq a dya.

## 2013-04-11 NOTE — Progress Notes (Signed)
[name] Date of Birth  Jun 30, 1960       [facility]    [employer] [address], [place], [place]  [address]   [place],   [address] [address]   Fax  [phone]     Fax [phone]  Problem List: 1. Hypertension  History of Present Illness:  Cynthia Craig is a 53 yo with history of hypertension for about 20 years. He previously was well controlled but recently it is not been well-controlled.  She's gained weight -  About 20 pounds over the last 2-3 years.  She still admits to eating salty foods on occasion.  She eats soup on occasion, occasional bacon, and vegetables.   She works at [employer].    She exercises on occasion.     Current Outpatient Prescriptions on File Prior to Visit  Medication Sig Dispense Refill  . ibuprofen (ADVIL,MOTRIN) 600 MG tablet TAKE 1 TABLET BY MOUTH EVERY 6 HOURS AS NEEDED FOR PAIN  75 tablet  0  . levothyroxine (SYNTHROID, LEVOTHROID) 50 MCG tablet Take 50 mcg by mouth daily.        Marland Kitchen losartan (COZAAR) 100 MG tablet Take 1 tablet (100 mg total) by mouth daily.  90 tablet  3   No current facility-administered medications on file prior to visit.    Allergies  Allergen Reactions  . Codeine Nausea And Vomiting    Past Medical History  Diagnosis Date  . Hypertension   . Hypothyroidism   . Arthritis     rt knee  . Allergy     codeine  . GERD (gastroesophageal reflux disease)     Past Surgical History  Procedure Laterality Date  . Cesarean section  1995  . Abdominal hysterectomy  2001  . Bladder repair  2009    mesh / prolapsed bladder  . Cholecystectomy  July 2013    History  Smoking status  . Never Smoker   Smokeless tobacco  . Never Used    History  Alcohol Use No    Family History  Problem Relation Age of Onset  . Thyroid disease Other   . Diabetes Mother   . Heart disease Mother     congestive heart failure  .  Asthma Mother   . Hypertension Mother   . Kidney disease Mother   . Cancer Maternal Grandmother 80    stomach  . Cancer Maternal Grandfather 80    lung  . Stomach cancer Paternal Grandmother   . Colon cancer Neg Hx   . Pancreatic cancer Neg Hx     Reviw of Systems:  Reviewed in the HPI.  All other systems are negative.  Physical Exam: Blood pressure 160/118, pulse 91, height 5\' 5"  (1.651 m), weight 193 lb 12.8 oz (87.907 kg). Wt Readings from Last 3 Encounters:  04/11/13 193 lb 12.8 oz (87.907 kg)  02/17/13 194 lb (87.998 kg)  11/28/12 194 lb (87.998 kg)     General: Well developed, well nourished, in no acute distress.  Head: Normocephalic, atraumatic, sclera non-icteric, mucus membranes are moist,   Neck: Supple. Carotids are 2 + without bruits. No JVD   Lungs: Clear   Heart: RR S1S2  Abdomen: Soft, non-tender, non-distended with normal bowel sounds.  Msk:  Strength and tone are normal  Extremities: No clubbing or cyanosis. No edema.  Distal pedal pulses are 2+ and equal  Neuro: CN II - XII intact.  Alert and oriented X 3.   Psych:  Normal  ECG: April 11, 2013: NSR at 91.  No ST or T wave changes.   Assessment / Plan:

## 2013-04-11 NOTE — Patient Instructions (Signed)
Your physician recommends that you return for lab work in: today bmet tsh  Your physician has recommended you make the following change in your medication:  START POTASSSIUM CHLORIDE/ KDUR 20 MEQ DAILY WITH HCTZ START BYSTOLIC 10 MG DAILY FOR HIGH BLOOD PRESSURE.  Your physician recommends that you return for lab work in: [place].  Your physician recommends that you schedule a follow-up appointment in: 3 MONTHS   REDUCE [place], GRAVY, SAUCES, READY PREPARED FOODS [place]; LEAN CUISINE, LASAGNA. BACON, SAUSAGE, LUNCH MEAT, FAST FOODS, HOT DOGS, CHIPS, PIZZA, CHINESE FOOD, [employer],   SOY SAUCE, STORE BOUGHT FRIED CHICKEN= [place] FRIED CHICKEN/ BOJANGLES. KETCHUP,  OLIVES,  PICKLES,

## 2013-04-12 ENCOUNTER — Telehealth: Payer: Self-pay | Admitting: Cardiovascular Disease

## 2013-04-12 NOTE — Telephone Encounter (Signed)
Follow up    Pt is returning Jodette's call

## 2013-04-12 NOTE — Telephone Encounter (Signed)
Pt called back after having her bp// 94/58. Pt was told to hold losartan// already taken today. She will eat a bit of sodium/ soup-crackers and hydrate. Pt states she is actually feeling better than prior and is at work. She will not take losartan tomorrow till is advised, she will take bystolic, hctz and k+. I will call her tomorrow to see how her bp is. Pt agreed to plan.

## 2013-04-12 NOTE — Telephone Encounter (Signed)
Pt took all bp meds in the morning with a bowel of cereal. Pt felt lethargic and dizzy. Pt was asked to get bp/p and she will get it done at her work. Pt agrees that she was dehydrated when she took all of her meds. Pt was told to hydrate first and also to take losartan later in day. Pt agreed to plan and was told to call with further questions or concerns.

## 2013-04-12 NOTE — Telephone Encounter (Signed)
New message    Seen in the office on yesterday . Started 2 new medication today . C/O not feeling right.

## 2013-04-12 NOTE — Progress Notes (Signed)
Pt was called and advised.

## 2013-04-13 NOTE — Telephone Encounter (Signed)
bp 124/80 @ 1:30, pt feeling well. Per Dr Nahser/ continue to hold losartan at this time.  Told to continue to monitor bp till Monday/ call with results. Pt agreed to plan.

## 2013-04-13 NOTE — Telephone Encounter (Signed)
Pt took meds this am after hydrating approx 9 am// 1.5 hrs ago and is feeling ok so far. Pt will get her bp after lunch today, I will call her to get the reading. Pt agreed to plan.

## 2013-05-02 ENCOUNTER — Ambulatory Visit (INDEPENDENT_AMBULATORY_CARE_PROVIDER_SITE_OTHER): Payer: 59 | Admitting: *Deleted

## 2013-05-02 DIAGNOSIS — I10 Essential (primary) hypertension: Secondary | ICD-10-CM

## 2013-05-02 LAB — BASIC METABOLIC PANEL
BUN: 13 mg/dL (ref 6–23)
CALCIUM: 9.5 mg/dL (ref 8.4–10.5)
CO2: 23 mEq/L (ref 19–32)
Chloride: 99 mEq/L (ref 96–112)
Creatinine, Ser: 0.9 mg/dL (ref 0.4–1.2)
GFR: 71.98 mL/min (ref >60.00)
GLUCOSE: 94 mg/dL (ref 70–99)
POTASSIUM: 3.8 meq/L (ref 3.5–5.1)
Sodium: 134 mEq/L — ABNORMAL LOW (ref 135–145)

## 2013-05-03 NOTE — Telephone Encounter (Signed)
See result note/ normal lab results, no complaint.

## 2013-06-29 ENCOUNTER — Ambulatory Visit: Payer: 59 | Admitting: Cardiovascular Disease

## 2013-08-03 ENCOUNTER — Ambulatory Visit: Payer: 59 | Admitting: Cardiovascular Disease

## 2013-09-08 ENCOUNTER — Ambulatory Visit: Payer: 59 | Admitting: Cardiovascular Disease

## 2013-10-15 ENCOUNTER — Emergency Department (HOSPITAL_COMMUNITY)
Admission: EM | Admit: 2013-10-15 | Discharge: 2013-10-15 | Disposition: A | Discharge status: Home / Self Care | Payer: 59 | Attending: Emergency Medicine | Admitting: Emergency Medicine

## 2013-10-15 ENCOUNTER — Encounter (HOSPITAL_COMMUNITY): Payer: Self-pay | Admitting: Emergency Medicine

## 2013-10-15 DIAGNOSIS — R51 Headache: Secondary | ICD-10-CM | POA: Insufficient documentation

## 2013-10-15 DIAGNOSIS — Z8719 Personal history of other diseases of the digestive system: Secondary | ICD-10-CM | POA: Diagnosis not present

## 2013-10-15 DIAGNOSIS — Z8639 Personal history of other endocrine, nutritional and metabolic disease: Secondary | ICD-10-CM | POA: Diagnosis not present

## 2013-10-15 DIAGNOSIS — Z79899 Other long term (current) drug therapy: Secondary | ICD-10-CM | POA: Insufficient documentation

## 2013-10-15 DIAGNOSIS — R040 Epistaxis: Secondary | ICD-10-CM

## 2013-10-15 DIAGNOSIS — M129 Arthropathy, unspecified: Secondary | ICD-10-CM | POA: Insufficient documentation

## 2013-10-15 DIAGNOSIS — Z862 Personal history of diseases of the blood and blood-forming organs and certain disorders involving the immune mechanism: Secondary | ICD-10-CM | POA: Diagnosis not present

## 2013-10-15 DIAGNOSIS — R519 Headache, unspecified: Secondary | ICD-10-CM

## 2013-10-15 DIAGNOSIS — I10 Essential (primary) hypertension: Secondary | ICD-10-CM

## 2013-10-15 LAB — I-STAT CHEM 8, ED
BUN: 14 mg/dL (ref 6–23)
CHLORIDE: 104 meq/L (ref 96–112)
CREATININE: 0.8 mg/dL (ref 0.50–1.10)
Calcium, Ion: 1.2 mmol/L (ref 1.12–1.23)
Glucose, Bld: 97 mg/dL (ref 70–99)
HEMATOCRIT: 43 % (ref 36.0–46.0)
Hemoglobin: 14.6 g/dL (ref 12.0–15.0)
POTASSIUM: 4.3 meq/L (ref 3.7–5.3)
SODIUM: 139 meq/L (ref 137–147)
TCO2: 24 mmol/L (ref 0–100)

## 2013-10-15 MED ORDER — NEBIVOLOL HCL 10 MG PO TABS
10.0000 mg | ORAL_TABLET | Freq: Every day | ORAL | Status: DC
  Administered 2013-10-15: 10 mg via ORAL
  Filled 2013-10-15: qty 1

## 2013-10-15 MED ORDER — HYDROCHLOROTHIAZIDE 25 MG PO TABS
25.0000 mg | ORAL_TABLET | Freq: Every day | ORAL | Status: DC
  Administered 2013-10-15: 25 mg via ORAL
  Filled 2013-10-15: qty 1

## 2013-10-15 NOTE — ED Notes (Signed)
Patient placed in room. 

## 2013-10-15 NOTE — ED Notes (Signed)
PA at the bedside.

## 2013-10-15 NOTE — Discharge Instructions (Signed)
Nosebleed °Nosebleeds can be caused by many conditions, including trauma, infections, polyps, foreign bodies, dry mucous membranes or climate, medicines, and air conditioning. Most nosebleeds occur in the front of the nose. Because of this location, most nosebleeds can be controlled by pinching the nostrils gently and continuously for at least 10 to 20 minutes. The long, continuous pressure allows enough time for the blood to clot. If pressure is released during that 10 to 20 minute time period, the process may have to be started again. The nosebleed may stop by itself or quit with pressure, or it may need concentrated heating (cautery) or pressure from packing. °HOME CARE INSTRUCTIONS  °· If your nose was packed, try to maintain the pack inside until your health care provider removes it. If a gauze pack was used and it starts to fall out, gently replace it or cut the end off. Do not cut if a balloon catheter was used to pack the nose. Otherwise, do not remove unless instructed. °· Avoid blowing your nose for 12 hours after treatment. This could dislodge the pack or clot and start the bleeding again. °· If the bleeding starts again, sit up and bend forward, gently pinching the front half of your nose continuously for 20 minutes. °· If bleeding was caused by dry mucous membranes, use over-the-counter saline nasal spray or gel. This will keep the mucous membranes moist and allow them to heal. If you must use a lubricant, choose the water-soluble variety. Use it only sparingly and not within several hours of lying down. °· Do not use petroleum jelly or mineral oil, as these may drip into the lungs and cause serious problems. °· Maintain humidity in your home by using less air conditioning or by using a humidifier. °· Do not use aspirin or medicines which make bleeding more likely. Your health care provider can give you recommendations on this. °· Resume normal activities as you are able, but try to avoid straining,  lifting, or bending at the waist for several days. °· If the nosebleeds become recurrent and the cause is unknown, your health care provider may suggest laboratory tests. °SEEK MEDICAL CARE IF: °You have a fever. °SEEK IMMEDIATE MEDICAL CARE IF:  °· Bleeding recurs and cannot be controlled. °· There is unusual bleeding from or bruising on other parts of the body. °· Nosebleeds continue. °· There is any worsening of the condition which originally brought you in. °· You become light-headed, feel faint, become sweaty, or vomit blood. °MAKE SURE YOU:  °· Understand these instructions. °· Will watch your condition. °· Will get help right away if you are not doing well or get worse. °Document Released: 10/15/2004 Document Revised: 05/22/2013 Document Reviewed: 12/06/2008 °ExitCare® Patient Information ©2015 [employer]. This information is not intended to replace advice given to you by your health care provider. Make sure you discuss any questions you have with your health care provider. °Hypertension °Hypertension, commonly called high blood pressure, is when the force of blood pumping through your arteries is too strong. Your arteries are the blood vessels that carry blood from your heart throughout your body. A blood pressure reading consists of a higher number over a lower number, such as 110/72. The higher number (systolic) is the pressure inside your arteries when your heart pumps. The lower number (diastolic) is the pressure inside your arteries when your heart relaxes. Ideally you want your blood pressure below 120/80. °Hypertension forces your heart to work harder to pump blood. Your arteries may become   narrow or stiff. Having hypertension puts you at risk for heart disease, stroke, and other problems.  °RISK FACTORS °Some risk factors for high blood pressure are controllable. Others are not.  °Risk factors you cannot control include:  °· Race. You may be at higher risk if you are African American. °· Age. Risk  increases with age. °· Gender. Men are at higher risk than women before age 45 years. After age 65, women are at higher risk than men. °Risk factors you can control include: °· Not getting enough exercise or physical activity. °· Being overweight. °· Getting too much fat, sugar, calories, or salt in your diet. °· Drinking too much alcohol. °SIGNS AND SYMPTOMS °Hypertension does not usually cause signs or symptoms. Extremely high blood pressure (hypertensive crisis) may cause headache, anxiety, shortness of breath, and nosebleed. °DIAGNOSIS  °To check if you have hypertension, your health care provider will measure your blood pressure while you are seated, with your arm held at the level of your heart. It should be measured at least twice using the same arm. Certain conditions can cause a difference in blood pressure between your right and left arms. A blood pressure reading that is higher than normal on one occasion does not mean that you need treatment. If one blood pressure reading is high, ask your health care provider about having it checked again. °TREATMENT  °Treating high blood pressure includes making lifestyle changes and possibly taking medicine. Living a healthy lifestyle can help lower high blood pressure. You may need to change some of your habits. °Lifestyle changes may include: °· Following the DASH diet. This diet is high in fruits, vegetables, and whole grains. It is low in salt, red meat, and added sugars. °· Getting at least 2½ hours of brisk physical activity every week. °· Losing weight if necessary. °· Not smoking. °· Limiting alcoholic beverages. °· Learning ways to reduce stress. ° If lifestyle changes are not enough to get your blood pressure under control, your health care provider may prescribe medicine. You may need to take more than one. Work closely with your health care provider to understand the risks and benefits. °HOME CARE INSTRUCTIONS °· Have your blood pressure rechecked as  directed by your health care provider.   °· Take medicines only as directed by your health care provider. Follow the directions carefully. Blood pressure medicines must be taken as prescribed. The medicine does not work as well when you skip doses. Skipping doses also puts you at risk for problems.   °· Do not smoke.   °· Monitor your blood pressure at home as directed by your health care provider.  °SEEK MEDICAL CARE IF:  °· You think you are having a reaction to medicines taken. °· You have recurrent headaches or feel dizzy. °· You have swelling in your ankles. °· You have trouble with your vision. °SEEK IMMEDIATE MEDICAL CARE IF: °· You develop a severe headache or confusion. °· You have unusual weakness, numbness, or feel faint. °· You have severe chest or abdominal pain. °· You vomit repeatedly. °· You have trouble breathing. °MAKE SURE YOU:  °· Understand these instructions. °· Will watch your condition. °· Will get help right away if you are not doing well or get worse. °Document Released: 01/05/2005 Document Revised: 05/22/2013 Document Reviewed: 10/28/2012 °ExitCare® Patient Information ©2015 [employer]. This information is not intended to replace advice given to you by your health care provider. Make sure you discuss any questions you have with your health care provider. ° °

## 2013-10-15 NOTE — ED Notes (Addendum)
Patient with nosebleed for the last 45 minutes without stopping.  She states that her nose is continuing to bleed.  Patient ran out of blood pressure meds on Thursday and has not  Been able to pick them up.  CAOx3.  Patient has had a headache all day.

## 2013-10-15 NOTE — ED Provider Notes (Signed)
CSN: [id number]     Arrival date & time 10/15/13  2042 History   First MD Initiated Contact with Patient 10/15/13 2118     Chief Complaint  Patient presents with  . Epistaxis   Patient is a 53 y.o. female presenting with nosebleeds.  Epistaxis Associated symptoms: headaches   Associated symptoms: no dizziness and no fever     Patient is a 53 y.o. Female with PMH of HTN, hypothyroid, and GERD who presents to the ED with epistaxis.  Per the patient she developed a nosebleed today at 8:00 pm which lasted for approximately 10 minutes and stopped for 5 minutes before restarting.  Patient states that the bleeding was coming from both nostrils and was a trickling type of bleed.  Patient tried pinching her nose with little relief.  Patient does not have a history of nosebleeds and is not currently on any type of blood thinning medications.  Patient states that she thinks that this is likely due to her blood pressure which has been elevated for the past four days.  Patient states that she ran out of her blood pressure medications on Thursday and has not taken it since although she does have the refill waiting for her at the pharmacy.  Patient also complains of a 5/10 occipital headache which started today.  Patient took some aleve at home which is relieving her pain.  Patient states that this is consistent with her normal headaches.    Past Medical History  Diagnosis Date  . Hypertension   . Hypothyroidism   . Arthritis     rt knee  . Allergy     codeine  . GERD (gastroesophageal reflux disease)    Past Surgical History  Procedure Laterality Date  . Cesarean section  1995  . Abdominal hysterectomy  2001  . Bladder repair  2009    mesh / prolapsed bladder  . Cholecystectomy  July 2013   Family History  Problem Relation Age of Onset  . Thyroid disease Other   . Diabetes Mother   . Heart disease Mother     congestive heart failure  . Asthma Mother   . Hypertension Mother   . Kidney  disease Mother   . Cancer Maternal Grandmother 80    stomach  . Cancer Maternal Grandfather 80    lung  . Stomach cancer Paternal Grandmother   . Colon cancer Neg Hx   . Pancreatic cancer Neg Hx    History  Substance Use Topics  . Smoking status: Never Smoker   . Smokeless tobacco: Never Used  . Alcohol Use: No   OB History   Grav Para Term Preterm Abortions TAB SAB Ect Mult Living                 Review of Systems  Constitutional: Negative for fever, chills and fatigue.  HENT: Positive for nosebleeds.   Eyes: Negative for visual disturbance.  Respiratory: Negative for chest tightness and shortness of breath.   Cardiovascular: Negative for chest pain.  Gastrointestinal: Negative for nausea, vomiting, diarrhea, constipation and blood in stool.  Neurological: Positive for headaches. Negative for dizziness, speech difficulty, weakness and numbness.  All other systems reviewed and are negative.     Allergies  Codeine  Home Medications   Prior to Admission medications   Medication Sig Start Date End Date Taking? Authorizing Provider  hydrochlorothiazide (HYDRODIURIL) 25 MG tablet Take 25 mg by mouth daily.  03/24/13  Yes Historical Provider, MD  ibuprofen (ADVIL,MOTRIN)  600 MG tablet Take 600 mg by mouth every 6 (six) hours as needed for mild pain or moderate pain.   Yes Historical Provider, MD  nebivolol (BYSTOLIC) 10 MG tablet Take 1 tablet (10 mg total) by mouth daily. 04/11/13  Yes Thayer Headings, MD   BP 168/91  Pulse 85  Temp(Src) 97.6 F (36.4 C) (Oral)  Resp 18  SpO2 98% Physical Exam  Nursing note and vitals reviewed. Constitutional: She is oriented to person, place, and time. She appears well-developed and well-nourished. No distress.  HENT:  Head: Normocephalic and atraumatic.  Mouth/Throat: No oropharyngeal exudate.  Evidence of dried crusted blood in the nares, but no active bleeding visualized at this time.    Eyes: Conjunctivae and EOM are normal.  Pupils are equal, round, and reactive to light. No scleral icterus.  Neck: Normal range of motion. Neck supple. No JVD present. No thyromegaly present.  Cardiovascular: Normal rate, regular rhythm, normal heart sounds and intact distal pulses.  Exam reveals no gallop and no friction rub.   No murmur heard. Pulmonary/Chest: Effort normal and breath sounds normal. No respiratory distress. She has no wheezes. She has no rales. She exhibits no tenderness.  Lymphadenopathy:    She has no cervical adenopathy.  Neurological: She is alert and oriented to person, place, and time. She has normal strength. No cranial nerve deficit or sensory deficit. Coordination normal.  Skin: Skin is warm and dry. She is not diaphoretic.  Psychiatric: She has a normal mood and affect. Her behavior is normal. Judgment and thought content normal.    ED Course  Procedures (including critical care time) Labs Review Labs Reviewed  I-STAT CHEM 8, ED    Imaging Review No results found.   EKG Interpretation None      MDM   Final diagnoses:  Epistaxis  Essential hypertension  Nonintractable headache, unspecified chronicity pattern, unspecified headache type   Patient is a 53 y.o. female who presents to the ED with nosebleed.  Nosebleed has resolved on examination here in the ED.  Vitals do show asymptomatic hypertension.  Istat chem 8 reveals no abnormalities.  Patient was given her home blood pressure medications here with some decrease in her pressures to 168/91.  Headache is resolved.  Patient to be discharged home at this time.  She is to return for nosebleed which lasts more than an hour despite pressure in the proper position, stroke like symptoms, or hypertensive emergency/urgency symptoms.  Patient states understanding and agreement.  Patient was discussed with Dr. Eulis Foster who agrees with the above treatment and plan.      Cherylann Parr, PA-C 10/15/13 2232

## 2013-10-16 NOTE — ED Provider Notes (Signed)
Medical screening examination/treatment/procedure(s) were performed by non-physician practitioner and as supervising physician I was immediately available for consultation/collaboration.   EKG Interpretation None       Richarda Blade, MD 10/16/13 [phone]

## 2013-10-20 ENCOUNTER — Ambulatory Visit (INDEPENDENT_AMBULATORY_CARE_PROVIDER_SITE_OTHER): Payer: 59 | Admitting: Internal Medicine

## 2013-10-20 ENCOUNTER — Encounter: Payer: Self-pay | Admitting: Internal Medicine

## 2013-10-20 ENCOUNTER — Telehealth: Payer: Self-pay | Admitting: Internal Medicine

## 2013-10-20 VITALS — BP 140/100 | HR 83 | Temp 98.3°F | Ht 65.0 in | Wt 192.0 lb

## 2013-10-20 DIAGNOSIS — R040 Epistaxis: Secondary | ICD-10-CM

## 2013-10-20 DIAGNOSIS — I10 Essential (primary) hypertension: Secondary | ICD-10-CM

## 2013-10-20 DIAGNOSIS — E038 Other specified hypothyroidism: Secondary | ICD-10-CM

## 2013-10-20 MED ORDER — LOSARTAN POTASSIUM 50 MG PO TABS: 50.0000 mg | ORAL_TABLET | Freq: Every day | ORAL | Status: DC

## 2013-10-20 NOTE — Patient Instructions (Addendum)
Please take all new medication as prescribed - the losartan 50 mg per day  Please continue all other medications as before, and refills have been done if requested.  Please have the pharmacy call with any other refills you may need.  Please continue your efforts at being more active, low cholesterol low salt diet, and weight control.  Please keep your appointments with your specialists as you may have planned  Please continue to monitor your blood pressure, as your goal is to be less than 140/90  Please return in 3 months, or sooner if needed, to Dr Ronnald Ramp

## 2013-10-20 NOTE — Progress Notes (Signed)
Pre visit review using our clinic review tool, if applicable. No additional management support is needed unless otherwise documented below in the visit note. 

## 2013-10-20 NOTE — Telephone Encounter (Signed)
Patient Information:  Caller Name: Cynthia Craig  Phone: [phone]  Patient: Cynthia, Craig  Gender: Female  DOB: Jul 11, 1960  Age: 53 Years  PCP: Scarlette Calico (Adults only)  Pregnant: No  Office Follow Up:  Does the office need to follow up with this patient?: No  Instructions For The Office: N/A  RN Note:  Pt will go to get a BP unit this am at [facility] so she has her own way of measuring her BP. If it is high today when she checks it or she gets another nosebleed she will call back. Otherwise she will be seen at her appt time.  Symptoms  Reason For Call & Symptoms: Pt is calling about having a nosebleed at 03:30 this am that lasted 20 min after pressure was applied correctly and consistently. She had also been to [facility] ED for a nosebleed with a headache on 10/15/2013.  She had not taken her BP meds since she had been out for 3 days. She takes HCTZ 25mg  and Bystolic 10mg . Her BP was 221/112. She went ahead and filled her script and restarted her medications. She has no home BP and doesn't know what her BP is. She feels fine at this moment. Her MD appt is at 16:14 today with Dr. Judi Cong.  Reviewed Health History In EMR: Yes  Reviewed Medications In EMR: Yes  Reviewed Allergies In EMR: Yes  Reviewed Surgeries / Procedures: Yes  Date of Onset of Symptoms: 10/20/2013 OB / GYN:  LMP: Unknown  Guideline(s) Used:  High Blood Pressure  Disposition Per Guideline:   See Today in Office  Reason For Disposition Reached:   Patient wants to be seen  Advice Given:  N/A  Patient Will Follow Care Advice:  YES  Appointment Scheduled:  10/20/2013 16:15:00 Appointment Scheduled Provider:  Cathlean Cower (Adults only)

## 2013-10-21 DIAGNOSIS — R040 Epistaxis: Secondary | ICD-10-CM | POA: Insufficient documentation

## 2013-10-21 NOTE — Progress Notes (Signed)
Subjective:    Patient ID: Cynthia Craig, female    DOB: 1960/01/21, 53 y.o.   MRN: [id number]  HPI  Here to f/u with recent nosebleed x 2, first 5 days ago small volume, without HA, sinus congestion, ST, cough and Pt denies chest pain, increased sob or doe, wheezing, orthopnea, PND, increased LE swelling, palpitations, dizziness or syncope.  Has noted on several occasions elev BP as well.  Second nosebleed onset this am, small again, lasted few minutes only without assoc symptoms as above.  No other bruising or bleeding. Pt denies new neurological symptoms such as new headache, or facial or extremity weakness or numbness   Pt denies polydipsia, polyuria.  Hard to lose wt. Denies hyper or hypo thyroid symptoms such as voice, skin or hair change. No recent diet change. No fever, pain or other stressors.  Denies worsening depressive symptoms, suicidal ideation, or panic.  Pt states card had d/c'd losartan 100 due to low blood pressure.  Good compliacne with current meds,  Past Medical History  Diagnosis Date  . Hypertension   . Hypothyroidism   . Arthritis     rt knee  . Allergy     codeine  . GERD (gastroesophageal reflux disease)    Past Surgical History  Procedure Laterality Date  . Cesarean section  1995  . Abdominal hysterectomy  2001  . Bladder repair  2009    mesh / prolapsed bladder  . Cholecystectomy  July 2013    reports that she has never smoked. She has never used smokeless tobacco. She reports that she does not drink alcohol or use illicit drugs. family history includes Asthma in her mother; Cancer (age of onset: 90) in her maternal grandfather and maternal grandmother; Diabetes in her mother; Heart disease in her mother; Hypertension in her mother; Kidney disease in her mother; Stomach cancer in her paternal grandmother; Thyroid disease in her other. There is no history of Colon cancer or Pancreatic cancer. Allergies  Allergen Reactions  . Codeine Nausea And Vomiting    Current Outpatient Prescriptions on File Prior to Visit  Medication Sig Dispense Refill  . hydrochlorothiazide (HYDRODIURIL) 25 MG tablet Take 25 mg by mouth daily.       Marland Kitchen ibuprofen (ADVIL,MOTRIN) 600 MG tablet Take 600 mg by mouth every 6 (six) hours as needed for mild pain or moderate pain.      . nebivolol (BYSTOLIC) 10 MG tablet Take 1 tablet (10 mg total) by mouth daily.  30 tablet  6   No current facility-administered medications on file prior to visit.   Review of Systems  Constitutional: Negative for unusual diaphoresis or other sweats  HENT: Negative for ringing in ear Eyes: Negative for double vision or worsening visual disturbance.  Respiratory: Negative for choking and stridor.   Gastrointestinal: Negative for vomiting or other signifcant bowel change Genitourinary: Negative for hematuria or decreased urine volume.  Musculoskeletal: Negative for other MSK pain or swelling Skin: Negative for color change and worsening wound.  Neurological: Negative for tremors and numbness other than noted  Psychiatric/Behavioral: Negative for decreased concentration or agitation other than above       Objective:   Physical Exam BP 140/100  Pulse 83  Temp(Src) 98.3 F (36.8 C) (Oral)  Ht 5\' 5"  (1.651 m)  Wt 192 lb (87.091 kg)  BMI 31.95 kg/m2  SpO2 96% VS noted,  Constitutional: Pt appears well-developed, well-nourished.  HENT: Head: NCAT.  Right Ear: External ear normal.  Left Ear:  External ear normal. Sinus NT, no current nosebleed or nasal passage swelling, tender  Eyes: . Pupils are equal, round, and reactive to light. Conjunctivae and EOM are normal Neck: Normal range of motion. Neck supple.  Cardiovascular: Normal rate and regular rhythm.   Pulmonary/Chest: Effort normal and breath sounds normal.  Neurological: Pt is alert. Not confused , motor grossly intact Skin: Skin is warm. No rash Psychiatric: Pt behavior is normal. No agitation.     Assessment & Plan:

## 2013-10-21 NOTE — Assessment & Plan Note (Signed)
Small volume x 2, o/w asympt, and no other bruising/bleeding, ok to follow for now, consider ENT if recurs

## 2013-10-21 NOTE — Assessment & Plan Note (Signed)
stable overall by history and exam, recent data reviewed with pt, and pt to continue medical treatment as before,  to f/u any worsening symptoms or concerns Lab Results  Component Value Date   TSH 2.82 04/11/2013

## 2013-10-21 NOTE — Assessment & Plan Note (Addendum)
BP Readings from Last 3 Encounters:  10/20/13 140/100  10/15/13 160/86  04/11/13 160/118   Uncontrolled, to add back losartan 50 qd, cont all other meds, f/u BP at home and next visit, for low salt diet, wt loss, regular excercise

## 2014-09-28 ENCOUNTER — Encounter: Payer: Self-pay | Admitting: Internal Medicine

## 2014-09-28 ENCOUNTER — Ambulatory Visit (INDEPENDENT_AMBULATORY_CARE_PROVIDER_SITE_OTHER): Payer: 59 | Admitting: Internal Medicine

## 2014-09-28 VITALS — BP 132/86 | HR 82 | Temp 98.2°F | Ht 65.0 in | Wt 200.0 lb

## 2014-09-28 DIAGNOSIS — R05 Cough: Secondary | ICD-10-CM | POA: Diagnosis not present

## 2014-09-28 DIAGNOSIS — I10 Essential (primary) hypertension: Secondary | ICD-10-CM

## 2014-09-28 DIAGNOSIS — R062 Wheezing: Secondary | ICD-10-CM

## 2014-09-28 DIAGNOSIS — R059 Cough, unspecified: Secondary | ICD-10-CM | POA: Insufficient documentation

## 2014-09-28 MED ORDER — LEVOFLOXACIN 250 MG PO TABS: 250.0000 mg | ORAL_TABLET | Freq: Every day | ORAL | Status: DC

## 2014-09-28 MED ORDER — METHYLPREDNISOLONE ACETATE 80 MG/ML IJ SUSP
80.0000 mg | Freq: Once | INTRAMUSCULAR | Status: AC
  Administered 2014-09-28: 80 mg via INTRAMUSCULAR

## 2014-09-28 MED ORDER — ALBUTEROL SULFATE HFA 108 (90 BASE) MCG/ACT IN AERS: 2.0000 | INHALATION_SPRAY | Freq: Four times a day (QID) | RESPIRATORY_TRACT | Status: DC | PRN

## 2014-09-28 MED ORDER — PREDNISONE 10 MG PO TABS: ORAL_TABLET | ORAL | Status: DC

## 2014-09-28 MED ORDER — HYDROCODONE-HOMATROPINE 5-1.5 MG/5ML PO SYRP: 5.0000 mL | ORAL_SOLUTION | Freq: Four times a day (QID) | ORAL | Status: DC | PRN

## 2014-09-28 NOTE — Addendum Note (Signed)
Addended by: Lyman Bishop on: 09/28/2014 01:46 PM   Modules accepted: Orders

## 2014-09-28 NOTE — Assessment & Plan Note (Addendum)
Mild to mod, for depomedrol IM, predpac asd, to f/u any worsening symptoms or concerns, also for prn Albut MDI

## 2014-09-28 NOTE — Progress Notes (Signed)
Subjective:    Patient ID: Cynthia Craig, female    DOB: 1960/07/09, 54 y.o.   MRN: [id number]  HPI  Here with acute onset mild to mod 2-3 wks worsening bilat ear pain, ST, HA, general weakness and malaise, with prod cough greenish sputum, but Pt denies chest pain, increased sob or doe, wheezing, orthopnea, PND, increased LE swelling, palpitations, dizziness or syncope, except for onset mild wheezing/sob since last 2 days Past Medical History  Diagnosis Date  . Hypertension   . Hypothyroidism   . Arthritis     rt knee  . Allergy     codeine  . GERD (gastroesophageal reflux disease)    Past Surgical History  Procedure Laterality Date  . Cesarean section  1995  . Abdominal hysterectomy  2001  . Bladder repair  2009    mesh / prolapsed bladder  . Cholecystectomy  July 2013    reports that she has never smoked. She has never used smokeless tobacco. She reports that she does not drink alcohol or use illicit drugs. family history includes Asthma in her mother; Cancer (age of onset: 62) in her maternal grandfather and maternal grandmother; Diabetes in her mother; Heart disease in her mother; Hypertension in her mother; Kidney disease in her mother; Stomach cancer in her paternal grandmother; Thyroid disease in her other. There is no history of Colon cancer or Pancreatic cancer. Allergies  Allergen Reactions  . Codeine Nausea And Vomiting   Current Outpatient Prescriptions on File Prior to Visit  Medication Sig Dispense Refill  . hydrochlorothiazide (HYDRODIURIL) 25 MG tablet Take 25 mg by mouth daily.     Marland Kitchen ibuprofen (ADVIL,MOTRIN) 600 MG tablet Take 600 mg by mouth every 6 (six) hours as needed for mild pain or moderate pain.    . nebivolol (BYSTOLIC) 10 MG tablet Take 1 tablet (10 mg total) by mouth daily. 30 tablet 6  . losartan (COZAAR) 50 MG tablet Take 1 tablet (50 mg total) by mouth daily. (Patient not taking: Reported on 09/28/2014) 90 tablet 3   No current facility-administered  medications on file prior to visit.   Review of Systems  Constitutional: Negative for unusual diaphoresis or night sweats HENT: Negative for ringing in ear or discharge Eyes: Negative for double vision or worsening visual disturbance.  Respiratory: Negative for choking and stridor.   Gastrointestinal: Negative for vomiting or other signifcant bowel change Genitourinary: Negative for hematuria or change in urine volume.  Musculoskeletal: Negative for other MSK pain or swelling Skin: Negative for color change and worsening wound.  Neurological: Negative for tremors and numbness other than noted  Psychiatric/Behavioral: Negative for decreased concentration or agitation other than above       Objective:   Physical Exam BP 132/86 mmHg  Pulse 82  Temp(Src) 98.2 F (36.8 C) (Oral)  Ht 5\' 5"  (1.651 m)  Wt 200 lb (90.719 kg)  BMI 33.28 kg/m2  SpO2 96% VS noted, mild ill Constitutional: Pt appears in no significant distress HENT: Head: NCAT.  Right Ear: External ear normal.  Left Ear: External ear normal.  Eyes: . Pupils are equal, round, and reactive to light. Conjunctivae and EOM are normal Bilat tm's with mild erythema.  Max sinus areas mild tender right > left, Pharynx with mild erythema, no exudate Neck: Normal range of motion. Neck supple.  Cardiovascular: Normal rate and regular rhythm.   Pulmonary/Chest: Effort normal and breath sounds decreased without rales, but few wheeze bilat Neurological: Pt is alert. Not confused ,  motor grossly intact Skin: Skin is warm. No rash, no LE edema Psychiatric: Pt behavior is normal. No agitation.     Assessment & Plan:

## 2014-09-28 NOTE — Progress Notes (Signed)
Pre visit review using our clinic review tool, if applicable. No additional management support is needed unless otherwise documented below in the visit note. 

## 2014-09-28 NOTE — Assessment & Plan Note (Signed)
Likely bronchitis, cant r/o pna, exam not c/w this however, declines cxr, for levaquin asd, cough med prn

## 2014-09-28 NOTE — Assessment & Plan Note (Signed)
stable overall by history and exam, recent data reviewed with pt, and pt to continue medical treatment as before,  to f/u any worsening symptoms or concerns BP Readings from Last 3 Encounters:  09/28/14 132/86  10/20/13 140/100  10/15/13 160/86

## 2014-09-28 NOTE — Patient Instructions (Addendum)
You had the steroid shot today  Please take all new medication as prescribed - the antibiotic, cough medicine, and inhaler as needed, and the prednisone  Please continue all other medications as before, and refills have been done if requested.  Please have the pharmacy call with any other refills you may need.  Please keep your appointments with your specialists as you may have planned

## 2014-10-31 LAB — HM PAP SMEAR

## 2014-10-31 LAB — HM MAMMOGRAPHY: HM Mammogram: NORMAL (ref 0–4)

## 2015-01-01 ENCOUNTER — Other Ambulatory Visit: Payer: Self-pay | Admitting: Internal Medicine

## 2015-01-01 ENCOUNTER — Other Ambulatory Visit (INDEPENDENT_AMBULATORY_CARE_PROVIDER_SITE_OTHER): Payer: 59

## 2015-01-01 ENCOUNTER — Ambulatory Visit (INDEPENDENT_AMBULATORY_CARE_PROVIDER_SITE_OTHER): Payer: 59 | Admitting: Internal Medicine

## 2015-01-01 ENCOUNTER — Encounter: Payer: Self-pay | Admitting: Internal Medicine

## 2015-01-01 ENCOUNTER — Other Ambulatory Visit: Payer: 59

## 2015-01-01 VITALS — BP 148/90 | HR 81 | Temp 98.4°F | Resp 16 | Ht 65.0 in | Wt 201.0 lb

## 2015-01-01 DIAGNOSIS — E038 Other specified hypothyroidism: Secondary | ICD-10-CM | POA: Diagnosis not present

## 2015-01-01 DIAGNOSIS — K589 Irritable bowel syndrome without diarrhea: Secondary | ICD-10-CM | POA: Diagnosis not present

## 2015-01-01 DIAGNOSIS — R10814 Left lower quadrant abdominal tenderness: Secondary | ICD-10-CM | POA: Insufficient documentation

## 2015-01-01 DIAGNOSIS — R197 Diarrhea, unspecified: Secondary | ICD-10-CM | POA: Diagnosis not present

## 2015-01-01 LAB — COMPREHENSIVE METABOLIC PANEL
ALT: 13 U/L (ref 0–35)
AST: 12 U/L (ref 0–37)
Albumin: 4 g/dL (ref 3.5–5.2)
Alkaline Phosphatase: 77 U/L (ref 39–117)
BUN: 9 mg/dL (ref 6–23)
CHLORIDE: 97 meq/L (ref 96–112)
CO2: 30 meq/L (ref 19–32)
CREATININE: 0.89 mg/dL (ref 0.40–1.20)
Calcium: 9.3 mg/dL (ref 8.4–10.5)
GFR: 70.59 mL/min (ref >60.00)
Glucose, Bld: 103 mg/dL — ABNORMAL HIGH (ref 70–99)
Potassium: 3.4 mEq/L — ABNORMAL LOW (ref 3.5–5.1)
SODIUM: 138 meq/L (ref 135–145)
Total Bilirubin: 0.3 mg/dL (ref 0.2–1.2)
Total Protein: 6.6 g/dL (ref 6.0–8.3)

## 2015-01-01 LAB — CBC WITH DIFFERENTIAL/PLATELET
BASOS ABS: 0.1 10*3/uL (ref 0.0–0.1)
Basophils Relative: 1.1 % (ref 0.0–3.0)
Eosinophils Absolute: 0.2 10*3/uL (ref 0.0–0.7)
Eosinophils Relative: 2.2 % (ref 0.0–5.0)
HCT: 43.8 % (ref 36.0–46.0)
Hemoglobin: 14.7 g/dL (ref 12.0–15.0)
LYMPHS PCT: 27.2 % (ref 12.0–46.0)
Lymphs Abs: 3 10*3/uL (ref 0.7–4.0)
MCHC: 33.6 g/dL (ref 30.0–36.0)
MCV: 91.2 fl (ref 78.0–100.0)
MONOS PCT: 5.1 % (ref 3.0–12.0)
Monocytes Absolute: 0.6 10*3/uL (ref 0.1–1.0)
NEUTROS ABS: 7.1 10*3/uL (ref 1.4–7.7)
Neutrophils Relative %: 64.4 % (ref 43.0–77.0)
Platelets: 354 10*3/uL (ref 150.0–400.0)
RBC: 4.8 Mil/uL (ref 3.87–5.11)
RDW: 13.7 % (ref 11.5–15.5)
WBC: 11 10*3/uL — ABNORMAL HIGH (ref 4.0–10.5)

## 2015-01-01 LAB — URINALYSIS, ROUTINE W REFLEX MICROSCOPIC
Bilirubin Urine: NEGATIVE
Hgb urine dipstick: NEGATIVE
Ketones, ur: NEGATIVE
Leukocytes, UA: NEGATIVE
NITRITE: NEGATIVE
Specific Gravity, Urine: 1.01 (ref 1.000–1.030)
Total Protein, Urine: NEGATIVE
Urine Glucose: NEGATIVE
Urobilinogen, UA: 0.2 (ref 0.0–1.0)
pH: 6.5 (ref 5.0–8.0)

## 2015-01-01 LAB — LIPASE: LIPASE: 25 U/L (ref 11.0–59.0)

## 2015-01-01 LAB — TSH: TSH: 3.97 u[IU]/mL (ref 0.35–4.50)

## 2015-01-01 LAB — AMYLASE: Amylase: 28 U/L (ref 27–131)

## 2015-01-01 LAB — C-REACTIVE PROTEIN: CRP: 0.9 mg/dL (ref 0.5–20.0)

## 2015-01-01 MED ORDER — ELUXADOLINE 75 MG PO TABS
1.0000 | ORAL_TABLET | Freq: Two times a day (BID) | ORAL | Status: DC
Start: 2015-01-01 — End: 2016-07-30

## 2015-01-01 NOTE — Patient Instructions (Signed)
Irritable Bowel Syndrome, Adult Irritable bowel syndrome (IBS) is not one specific disease. It is a group of symptoms that affects the organs responsible for digestion (gastrointestinal or GI tract).  To regulate how your GI tract works, your body sends signals back and forth between your intestines and your brain. If you have IBS, there may be a problem with these signals. As a result, your GI tract does not function normally. Your intestines may become more sensitive and overreact to certain things. This is especially true when you eat certain foods or when you are under stress.  There are four types of IBS. These may be determined based on the consistency of your stool:   IBS with diarrhea.   IBS with constipation.   Mixed IBS.   Unsubtyped IBS.  It is important to know which type of IBS you have. Some treatments are more likely to be helpful for certain types of IBS.  CAUSES  The exact cause of IBS is not known. RISK FACTORS You may have a higher risk of IBS if:  You are a woman.  You are younger than 52 years old.  You have a family history of IBS.  You have mental health problems.  You have had bacterial infection of your GI tract. SIGNS AND SYMPTOMS  Symptoms of IBS vary from person to person. The main symptom is abdominal pain or discomfort. Additional symptoms usually include one or more of the following:   Diarrhea, constipation, or both.   Abdominal swelling or bloating.   Feeling full or sick after eating a small or regular-size meal.   Frequent gas.   Mucus in the stool.   A feeling of having more stool left after a bowel movement.  Symptoms tend to come and go. They may be associated with stress, psychiatric conditions, or nothing at all.  DIAGNOSIS  There is no specific test to diagnose IBS. Your health care provider will make a diagnosis based on a physical exam, medical history, and your symptoms. You may have other tests to rule out other  conditions that may be causing your symptoms. These may include:   Blood tests.   X-rays.   CT scan.  Endoscopy and colonoscopy. This is a test in which your GI tract is viewed with a long, thin, flexible tube. TREATMENT There is no cure for IBS, but treatment can help relieve symptoms. IBS treatment often includes:   Changes to your diet, such as:  Eating more fiber.  Avoiding foods that cause symptoms.  Drinking more water.  Eating regular, medium-sized portioned meals.  Medicines. These may include:  Fiber supplements if you have constipation.  Medicine to control diarrhea (antidiarrheal medicines).  Medicine to help control muscle spasms in your GI tract (antispasmodic medicines).  Medicines to help with any mental health issues, such as antidepressants or tranquilizers.  Therapy.  Talk therapy may help with anxiety, depression, or other mental health issues that can make IBS symptoms worse.  Stress reduction.  Managing your stress can help keep symptoms under control. HOME CARE INSTRUCTIONS   Take medicines only as directed by your health care provider.  Eat a healthy diet.  Avoid foods and drinks with added sugar.  Include more whole grains, fruits, and vegetables gradually into your diet. This may be especially helpful if you have IBS with constipation.  Avoid any foods and drinks that make your symptoms worse. These may include dairy products and caffeinated or carbonated drinks.  Do not eat large meals.    Drink enough fluid to keep your urine clear or pale yellow.  Exercise regularly. Ask your health care provider for recommendations of good activities for you.  Keep all follow-up visits as directed by your health care provider. This is important. SEEK MEDICAL CARE IF:   You have constant pain.  You have trouble or pain with swallowing.  You have worsening diarrhea. SEEK IMMEDIATE MEDICAL CARE IF:   You have severe and worsening abdominal  pain.   You have diarrhea and:   You have a rash, stiff neck, or severe headache.   You are irritable, sleepy, or difficult to awaken.   You are weak, dizzy, or extremely thirsty.   You have bright red blood in your stool or you have black tarry stools.   You have unusual abdominal swelling that is painful.   You vomit continuously.   You vomit blood (hematemesis).   You have both abdominal pain and a fever.    This information is not intended to replace advice given to you by your health care provider. Make sure you discuss any questions you have with your health care provider.   Document Released: 01/05/2005 Document Revised: 01/26/2014 Document Reviewed: 09/22/2013 Elsevier Interactive Patient Education 2016 [employer]  

## 2015-01-01 NOTE — Progress Notes (Signed)
Pre visit review using our clinic review tool, if applicable. No additional management support is needed unless otherwise documented below in the visit note. 

## 2015-01-02 ENCOUNTER — Encounter: Payer: Self-pay | Admitting: Internal Medicine

## 2015-01-02 LAB — C. DIFFICILE GDH AND TOXIN A/B
C. difficile GDH: NOT DETECTED
C. difficile Toxin A/B: NOT DETECTED

## 2015-01-02 LAB — FECAL LACTOFERRIN, QUANT: LACTOFERRIN: NEGATIVE

## 2015-01-02 NOTE — Progress Notes (Signed)
Subjective:  Patient ID: Cynthia Craig, female    DOB: [birth year]  Age: 54 y.o. MRN: [id number]  CC: Abdominal Pain   HPI Cynthia Craig presents for recurrent episodes of crampy abdominal pain with bloating and diarrhea. She had the same thing happen many years ago and was told that she has irritable bowel syndrome. She eventually saw gastroenterology and was told that her scopes were all normal. Now for the last 2-3 months she has had ongoing symptoms. She did take a course of antibiotics about 2 or 3 months ago. She complains of loose painful bowel movements about 2-3 times a day. She occasionally has nausea but never vomits. In additional to the crampy pain there is intermittent bloating.  Outpatient Prescriptions Prior to Visit  Medication Sig Dispense Refill  . albuterol (PROVENTIL HFA;VENTOLIN HFA) 108 (90 BASE) MCG/ACT inhaler Inhale 2 puffs into the lungs every 6 (six) hours as needed for wheezing or shortness of breath. 1 Inhaler 1  . hydrochlorothiazide (HYDRODIURIL) 25 MG tablet Take 25 mg by mouth daily.     Marland Kitchen losartan (COZAAR) 50 MG tablet Take 1 tablet (50 mg total) by mouth daily. 90 tablet 3  . nebivolol (BYSTOLIC) 10 MG tablet Take 1 tablet (10 mg total) by mouth daily. 30 tablet 6  . benzonatate (TESSALON) 200 MG capsule TK ONE C PO TID PRF COUGH  3  . fluticasone (FLONASE) 50 MCG/ACT nasal spray USE 2 SPRAYS IN EACH NOSTRIL QHS  0  . HYDROcodone-homatropine (HYCODAN) 5-1.5 MG/5ML syrup Take 5 mLs by mouth every 6 (six) hours as needed for cough. 180 mL 0  . levofloxacin (LEVAQUIN) 250 MG tablet Take 1 tablet (250 mg total) by mouth daily. 10 tablet 0  . predniSONE (DELTASONE) 10 MG tablet 3 tabs by mouth per day for 3 days,2tabs per day for 3 days,1tab per day for 3 days 18 tablet 0  . ibuprofen (ADVIL,MOTRIN) 600 MG tablet Take 600 mg by mouth every 6 (six) hours as needed for mild pain or moderate pain.     No facility-administered medications prior to visit.     ROS Review of Systems  Constitutional: Negative.  Negative for fever, chills, diaphoresis, activity change, appetite change and fatigue.  HENT: Negative.  Negative for trouble swallowing.   Eyes: Negative.   Respiratory: Negative.  Negative for cough, choking, chest tightness, shortness of breath and stridor.   Cardiovascular: Negative.  Negative for chest pain, palpitations and leg swelling.  Gastrointestinal: Positive for nausea and abdominal pain. Negative for vomiting, diarrhea, constipation, blood in stool, abdominal distention, anal bleeding and rectal pain.  Endocrine: Negative.   Genitourinary: Negative.  Negative for dysuria, urgency, frequency, hematuria, decreased urine volume and difficulty urinating.  Musculoskeletal: Negative.   Skin: Negative.  Negative for color change and rash.  Allergic/Immunologic: Negative.   Neurological: Negative.   Hematological: Negative.  Negative for adenopathy. Does not bruise/bleed easily.  Psychiatric/Behavioral: Negative.     Objective:  BP 148/90 mmHg  Pulse 81  Temp(Src) 98.4 F (36.9 C) (Oral)  Resp 16  Ht 5\' 5"  (1.651 m)  Wt 201 lb (91.173 kg)  BMI 33.45 kg/m2  SpO2 97%  BP Readings from Last 3 Encounters:  01/01/15 148/90  09/28/14 132/86  10/20/13 140/100    Wt Readings from Last 3 Encounters:  01/01/15 201 lb (91.173 kg)  09/28/14 200 lb (90.719 kg)  10/20/13 192 lb (87.091 kg)    Physical Exam  Constitutional: She is oriented to person,  place, and time.  Non-toxic appearance. She does not have a sickly appearance. She does not appear ill. No distress.  HENT:  Mouth/Throat: Oropharynx is clear and moist. No oropharyngeal exudate.  Eyes: Conjunctivae are normal. Right eye exhibits no discharge. Left eye exhibits no discharge. No scleral icterus.  Neck: Normal range of motion. Neck supple. No JVD present. No tracheal deviation present. No thyromegaly present.  Cardiovascular: Normal rate, regular rhythm, normal  heart sounds and intact distal pulses.  Exam reveals no gallop and no friction rub.   No murmur heard. Pulmonary/Chest: Effort normal and breath sounds normal. No stridor. No respiratory distress. She has no wheezes. She has no rales. She exhibits no tenderness.  Abdominal: Soft. Normal appearance and bowel sounds are normal. She exhibits no shifting dullness, no distension, no ascites and no mass. There is no hepatosplenomegaly. There is tenderness in the left lower quadrant. There is no rigidity, no rebound, no guarding, no CVA tenderness, no tenderness at McBurney's point and negative Murphy's sign.  Musculoskeletal: Normal range of motion. She exhibits no edema or tenderness.  Lymphadenopathy:    She has no cervical adenopathy.  Neurological: She is oriented to person, place, and time.  Skin: Skin is warm and dry. No rash noted. She is not diaphoretic. No erythema. No pallor.    Lab Results  Component Value Date   WBC 11.0* 01/01/2015   HGB 14.7 01/01/2015   HCT 43.8 01/01/2015   PLT 354.0 01/01/2015   GLUCOSE 103* 01/01/2015   CHOL 242 08/01/2009   TRIG 188 08/01/2009   HDL 43 08/01/2009   LDLCALC 161 08/01/2009   ALT 13 01/01/2015   AST 12 01/01/2015   NA 138 01/01/2015   K 3.4* 01/01/2015   CL 97 01/01/2015   CREATININE 0.89 01/01/2015   BUN 9 01/01/2015   CO2 30 01/01/2015   TSH 3.97 01/01/2015   HGBA1C 5.4 04/05/2012    No results found.  Assessment & Plan:   Cynthia Craig was seen today for abdominal pain.  Diagnoses and all orders for this visit:  Other specified hypothyroidism- her TSH is in the normal range, she will stay on the current dose. -     TSH; Future  IBS (irritable bowel syndrome) -     Eluxadoline (VIBERZI) 75 MG TABS; Take 1 tablet by mouth 2 (two) times daily.  Abdominal tenderness, LLQ (left lower quadrant)- she does not have an acute abdominal examination, her labs do not indicate any suspicious abdominal process. Will treat her for irritable bowel  syndrome with diarrhea and cramps. -     Lipase; Future -     Amylase; Future -     Comprehensive metabolic panel; Future -     CBC with Differential/Platelet; Future -     C-reactive protein; Future -     Urinalysis, Routine w reflex microscopic (not at [facility]); Future  Diarrhea, unspecified type- her WBC count is very slightly but chronically elevated, stool for C. difficile toxin is negative and lactoferrin is negative, will screen her for celiac disease. All of her other labs are unremarkable. Will start treating her for IBS with diarrhea. -     C-reactive protein; Future -     Gliadin antibodies, serum -     Tissue transglutaminase, IgA -     Reticulin Antibody, IgA w reflex titer -     Clostridium difficile EIA; Future -     Fecal lactoferrin; Future -     Eluxadoline (VIBERZI) 75 MG  TABS; Take 1 tablet by mouth 2 (two) times daily.   I have discontinued Ms. Isley's benzonatate, fluticasone, predniSONE, HYDROcodone-homatropine, and levofloxacin. I am also having her start on Eluxadoline. Additionally, I am having her maintain her hydrochlorothiazide, nebivolol, ibuprofen, losartan, and albuterol.  Meds ordered this encounter  Medications  . Eluxadoline (VIBERZI) 75 MG TABS    Sig: Take 1 tablet by mouth 2 (two) times daily.    Dispense:  60 tablet    Refill:  5     Follow-up: Return in about 3 weeks (around 01/22/2015).  Scarlette Calico, MD

## 2015-04-03 ENCOUNTER — Ambulatory Visit (INDEPENDENT_AMBULATORY_CARE_PROVIDER_SITE_OTHER): Payer: 59 | Admitting: Internal Medicine

## 2015-04-03 ENCOUNTER — Encounter: Payer: Self-pay | Admitting: Internal Medicine

## 2015-04-03 VITALS — BP 138/100 | HR 94 | Temp 98.1°F | Resp 16 | Ht 65.0 in | Wt 218.0 lb

## 2015-04-03 DIAGNOSIS — J301 Allergic rhinitis due to pollen: Secondary | ICD-10-CM | POA: Diagnosis not present

## 2015-04-03 DIAGNOSIS — I10 Essential (primary) hypertension: Secondary | ICD-10-CM

## 2015-04-03 DIAGNOSIS — J0100 Acute maxillary sinusitis, unspecified: Secondary | ICD-10-CM | POA: Diagnosis not present

## 2015-04-03 MED ORDER — HYDROCHLOROTHIAZIDE 25 MG PO TABS: 25.0000 mg | ORAL_TABLET | Freq: Every day | ORAL | Status: DC

## 2015-04-03 MED ORDER — METHYLPREDNISOLONE ACETATE 80 MG/ML IJ SUSP
120.0000 mg | Freq: Once | INTRAMUSCULAR | Status: AC
  Administered 2015-04-03: 120 mg via INTRAMUSCULAR

## 2015-04-03 MED ORDER — CETIRIZINE HCL 10 MG PO TABS: 10.0000 mg | ORAL_TABLET | Freq: Every day | ORAL | Status: DC

## 2015-04-03 MED ORDER — MOMETASONE FUROATE 50 MCG/ACT NA SUSP: 4.0000 | Freq: Every day | NASAL | Status: DC

## 2015-04-03 MED ORDER — NEBIVOLOL HCL 10 MG PO TABS: 10.0000 mg | ORAL_TABLET | Freq: Every day | ORAL | Status: DC

## 2015-04-03 NOTE — Progress Notes (Signed)
Pre visit review using our clinic review tool, if applicable. No additional management support is needed unless otherwise documented below in the visit note. 

## 2015-04-03 NOTE — Patient Instructions (Signed)
Hypertension Hypertension, commonly called high blood pressure, is when the force of blood pumping through your arteries is too strong. Your arteries are the blood vessels that carry blood from your heart throughout your body. A blood pressure reading consists of a higher number over a lower number, such as 110/72. The higher number (systolic) is the pressure inside your arteries when your heart pumps. The lower number (diastolic) is the pressure inside your arteries when your heart relaxes. Ideally you want your blood pressure below 120/80. Hypertension forces your heart to work harder to pump blood. Your arteries may become narrow or stiff. Having untreated or uncontrolled hypertension can cause heart attack, stroke, kidney disease, and other problems. RISK FACTORS Some risk factors for high blood pressure are controllable. Others are not.  Risk factors you cannot control include:   Race. You may be at higher risk if you are African American.  Age. Risk increases with age.  Gender. Men are at higher risk than women before age 45 years. After age 65, women are at higher risk than men. Risk factors you can control include:  Not getting enough exercise or physical activity.  Being overweight.  Getting too much fat, sugar, calories, or salt in your diet.  Drinking too much alcohol. SIGNS AND SYMPTOMS Hypertension does not usually cause signs or symptoms. Extremely high blood pressure (hypertensive crisis) may cause headache, anxiety, shortness of breath, and nosebleed. DIAGNOSIS To check if you have hypertension, your health care provider will measure your blood pressure while you are seated, with your arm held at the level of your heart. It should be measured at least twice using the same arm. Certain conditions can cause a difference in blood pressure between your right and left arms. A blood pressure reading that is higher than normal on one occasion does not mean that you need treatment. If  it is not clear whether you have high blood pressure, you may be asked to return on a different day to have your blood pressure checked again. Or, you may be asked to monitor your blood pressure at home for 1 or more weeks. TREATMENT Treating high blood pressure includes making lifestyle changes and possibly taking medicine. Living a healthy lifestyle can help lower high blood pressure. You may need to change some of your habits. Lifestyle changes may include:  Following the DASH diet. This diet is high in fruits, vegetables, and whole grains. It is low in salt, red meat, and added sugars.  Keep your sodium intake below 2,300 mg per day.  Getting at least 30-45 minutes of aerobic exercise at least 4 times per week.  Losing weight if necessary.  Not smoking.  Limiting alcoholic beverages.  Learning ways to reduce stress. Your health care provider may prescribe medicine if lifestyle changes are not enough to get your blood pressure under control, and if one of the following is true:  You are 18-59 years of age and your systolic blood pressure is above 140.  You are 60 years of age or older, and your systolic blood pressure is above 150.  Your diastolic blood pressure is above 90.  You have diabetes, and your systolic blood pressure is over 140 or your diastolic blood pressure is over 90.  You have kidney disease and your blood pressure is above 140/90.  You have heart disease and your blood pressure is above 140/90. Your personal target blood pressure may vary depending on your medical conditions, your age, and other factors. HOME CARE INSTRUCTIONS    Have your blood pressure rechecked as directed by your health care provider.   Take medicines only as directed by your health care provider. Follow the directions carefully. Blood pressure medicines must be taken as prescribed. The medicine does not work as well when you skip doses. Skipping doses also puts you at risk for  problems.  Do not smoke.   Monitor your blood pressure at home as directed by your health care provider. SEEK MEDICAL CARE IF:   You think you are having a reaction to medicines taken.  You have recurrent headaches or feel dizzy.  You have swelling in your ankles.  You have trouble with your vision. SEEK IMMEDIATE MEDICAL CARE IF:  You develop a severe headache or confusion.  You have unusual weakness, numbness, or feel faint.  You have severe chest or abdominal pain.  You vomit repeatedly.  You have trouble breathing. MAKE SURE YOU:   Understand these instructions.  Will watch your condition.  Will get help right away if you are not doing well or get worse.   This information is not intended to replace advice given to you by your health care provider. Make sure you discuss any questions you have with your health care provider.   Document Released: 01/05/2005 Document Revised: 05/22/2014 Document Reviewed: 10/28/2012 Elsevier Interactive Patient Education 2016 [employer]  

## 2015-04-03 NOTE — Progress Notes (Signed)
Subjective:  Patient ID: Cynthia Craig, female    DOB: 11-18-1960  Age: 55 y.o. MRN: [id number]  CC: Hypertension; Allergic Rhinitis ; and Sinusitis   HPI NAMI VANDERHAM presents for a blood pressure check and concerns about a sinus infection.  She complains of postnasal drip for 3 months and now for the last 5 days has had right facial pain and a runny nose that is productive of thick yellow-green phlegm. She complains of nasal congestion and fever but no chills. She denies night sweats, shortness of breath or cough. She has been taking Sudafed for symptom relief.  She tells me that her blood pressure has not been well controlled since she has been taking Sudafed. Fortunately, she denies headache, blurred vision, chest pain, shortness of breath, edema, or dizziness. Prior to taking the Sudafed her blood pressure was well controlled with a combination of hydrochlorothiazide, losartan, and nebivolol.  Outpatient Prescriptions Prior to Visit  Medication Sig Dispense Refill  . albuterol (PROVENTIL HFA;VENTOLIN HFA) 108 (90 BASE) MCG/ACT inhaler Inhale 2 puffs into the lungs every 6 (six) hours as needed for wheezing or shortness of breath. 1 Inhaler 1  . Eluxadoline (VIBERZI) 75 MG TABS Take 1 tablet by mouth 2 (two) times daily. 60 tablet 5  . hydrochlorothiazide (HYDRODIURIL) 25 MG tablet Take 25 mg by mouth daily.     Marland Kitchen ibuprofen (ADVIL,MOTRIN) 600 MG tablet Take 600 mg by mouth every 6 (six) hours as needed for mild pain or moderate pain.    . nebivolol (BYSTOLIC) 10 MG tablet Take 1 tablet (10 mg total) by mouth daily. 30 tablet 6  . losartan (COZAAR) 50 MG tablet Take 1 tablet (50 mg total) by mouth daily. 90 tablet 3   No facility-administered medications prior to visit.    ROS Review of Systems  Constitutional: Negative.   HENT: Positive for congestion, postnasal drip, rhinorrhea, sinus pressure and sore throat. Negative for facial swelling, sneezing and tinnitus.   Eyes:  Negative.  Negative for visual disturbance.  Respiratory: Negative.  Negative for apnea, cough, shortness of breath and stridor.   Cardiovascular: Negative.  Negative for chest pain, palpitations and leg swelling.  Gastrointestinal: Negative.  Negative for vomiting, abdominal pain, diarrhea and constipation.  Endocrine: Negative.   Genitourinary: Negative.   Musculoskeletal: Negative.   Skin: Negative.   Allergic/Immunologic: Negative.   Neurological: Negative.   Hematological: Negative.  Negative for adenopathy. Does not bruise/bleed easily.  Psychiatric/Behavioral: Negative.     Objective:  BP 138/100 mmHg  Pulse 94  Temp(Src) 98.1 F (36.7 C) (Oral)  Resp 16  Ht 5\' 5"  (1.651 m)  Wt 218 lb (98.884 kg)  BMI 36.28 kg/m2  SpO2 95%  BP Readings from Last 3 Encounters:  04/03/15 138/100  01/01/15 148/90  09/28/14 132/86    Wt Readings from Last 3 Encounters:  04/03/15 218 lb (98.884 kg)  01/01/15 201 lb (91.173 kg)  09/28/14 200 lb (90.719 kg)    Physical Exam  Constitutional: She is oriented to person, place, and time. She appears well-developed and well-nourished.  Non-toxic appearance. She does not have a sickly appearance. She does not appear ill. No distress.  HENT:  Head: Normocephalic and atraumatic.  Nose: Mucosal edema and rhinorrhea present. Right sinus exhibits maxillary sinus tenderness. Right sinus exhibits no frontal sinus tenderness. Left sinus exhibits no maxillary sinus tenderness and no frontal sinus tenderness.  Mouth/Throat: Oropharynx is clear and moist and mucous membranes are normal. Mucous membranes are not  pale, not dry and not cyanotic. No oropharyngeal exudate or posterior oropharyngeal erythema.  Eyes: Conjunctivae are normal. Right eye exhibits no discharge. Left eye exhibits no discharge. No scleral icterus.  Neck: Normal range of motion. Neck supple. No JVD present. No tracheal deviation present. No thyromegaly present.  Cardiovascular: Normal  rate, regular rhythm, normal heart sounds and intact distal pulses.  Exam reveals no gallop and no friction rub.   No murmur heard. Pulmonary/Chest: Effort normal and breath sounds normal. No stridor. No respiratory distress. She has no wheezes. She has no rales. She exhibits no tenderness.  Abdominal: Soft. Bowel sounds are normal. She exhibits no distension and no mass. There is no tenderness. There is no rebound and no guarding.  Musculoskeletal: Normal range of motion. She exhibits no edema or tenderness.  Lymphadenopathy:    She has no cervical adenopathy.  Neurological: She is oriented to person, place, and time.  Skin: Skin is warm and dry. No rash noted. She is not diaphoretic. No erythema. No pallor.  Vitals reviewed.   Lab Results  Component Value Date   WBC 11.0* 01/01/2015   HGB 14.7 01/01/2015   HCT 43.8 01/01/2015   PLT 354.0 01/01/2015   GLUCOSE 103* 01/01/2015   CHOL 242 08/01/2009   TRIG 188 08/01/2009   HDL 43 08/01/2009   LDLCALC 161 08/01/2009   ALT 13 01/01/2015   AST 12 01/01/2015   NA 138 01/01/2015   K 3.4* 01/01/2015   CL 97 01/01/2015   CREATININE 0.89 01/01/2015   BUN 9 01/01/2015   CO2 30 01/01/2015   TSH 3.97 01/01/2015   HGBA1C 5.4 04/05/2012    No results found.  Assessment & Plan:   Luvena was seen today for hypertension, allergic rhinitis  and sinusitis.  Diagnoses and all orders for this visit:  Essential hypertension- her blood pressure is not adequately well controlled but this is most likely due to her recent use of decongestants, she will continue the current medications for now, I will recheck her potassium level today. -     hydrochlorothiazide (HYDRODIURIL) 25 MG tablet; Take 1 tablet (25 mg total) by mouth daily. -     nebivolol (BYSTOLIC) 10 MG tablet; Take 1 tablet (10 mg total) by mouth daily. -     Basic metabolic panel; Future  Acute maxillary sinusitis, recurrence not specified- I will treat the infection with  Ceftin  Allergic rhinitis due to pollen- she has a moderately severe flare of nasal allergies, I've asked her to start taking Zyrtec and to use Nasonex nasal spray, she is advised to avoid decongestants, I've also given her injection of Depo-Medrol to help reduce her symptoms. -     cetirizine (ZYRTEC) 10 MG tablet; Take 1 tablet (10 mg total) by mouth daily. -     mometasone (NASONEX) 50 MCG/ACT nasal spray; Place 4 sprays into the nose daily. -     methylPREDNISolone acetate (DEPO-MEDROL) injection 120 mg; Inject 1.5 mLs (120 mg total) into the muscle once.   I have discontinued Ms. Marsiglia's ibuprofen and losartan. I have also changed her hydrochlorothiazide. Additionally, I am having her start on cetirizine and mometasone. Lastly, I am having her maintain her albuterol, Eluxadoline, and nebivolol. We administered methylPREDNISolone acetate.  Meds ordered this encounter  Medications  . hydrochlorothiazide (HYDRODIURIL) 25 MG tablet    Sig: Take 1 tablet (25 mg total) by mouth daily.    Dispense:  90 tablet    Refill:  1  .  nebivolol (BYSTOLIC) 10 MG tablet    Sig: Take 1 tablet (10 mg total) by mouth daily.    Dispense:  90 tablet    Refill:  1    MAY HAVE 90 DAY  . cetirizine (ZYRTEC) 10 MG tablet    Sig: Take 1 tablet (10 mg total) by mouth daily.    Dispense:  30 tablet    Refill:  11  . mometasone (NASONEX) 50 MCG/ACT nasal spray    Sig: Place 4 sprays into the nose daily.    Dispense:  17 g    Refill:  12  . methylPREDNISolone acetate (DEPO-MEDROL) injection 120 mg    Sig:      Follow-up: Return in about 6 weeks (around 05/15/2015).  Scarlette Calico, MD

## 2015-04-04 ENCOUNTER — Other Ambulatory Visit (INDEPENDENT_AMBULATORY_CARE_PROVIDER_SITE_OTHER): Payer: 59

## 2015-04-04 ENCOUNTER — Encounter: Payer: Self-pay | Admitting: Internal Medicine

## 2015-04-04 DIAGNOSIS — I10 Essential (primary) hypertension: Secondary | ICD-10-CM

## 2015-04-04 LAB — BASIC METABOLIC PANEL
BUN: 13 mg/dL (ref 6–23)
CO2: 31 mEq/L (ref 19–32)
Calcium: 9.9 mg/dL (ref 8.4–10.5)
Chloride: 97 mEq/L (ref 96–112)
Creatinine, Ser: 0.93 mg/dL (ref 0.40–1.20)
GFR: 67.03 mL/min (ref >60.00)
Glucose, Bld: 104 mg/dL — ABNORMAL HIGH (ref 70–99)
Potassium: 3.7 mEq/L (ref 3.5–5.1)
SODIUM: 136 meq/L (ref 135–145)

## 2015-04-04 MED ORDER — CEFUROXIME AXETIL 500 MG PO TABS: 500.0000 mg | ORAL_TABLET | Freq: Two times a day (BID) | ORAL | Status: AC

## 2015-06-04 ENCOUNTER — Ambulatory Visit: Payer: 59 | Admitting: Internal Medicine

## 2015-06-04 DIAGNOSIS — Z0289 Encounter for other administrative examinations: Secondary | ICD-10-CM

## 2015-06-11 ENCOUNTER — Ambulatory Visit (INDEPENDENT_AMBULATORY_CARE_PROVIDER_SITE_OTHER): Payer: 59 | Admitting: Internal Medicine

## 2015-06-11 ENCOUNTER — Encounter: Payer: Self-pay | Admitting: Internal Medicine

## 2015-06-11 ENCOUNTER — Other Ambulatory Visit (INDEPENDENT_AMBULATORY_CARE_PROVIDER_SITE_OTHER): Payer: 59

## 2015-06-11 VITALS — BP 160/104 | HR 77 | Temp 98.4°F | Resp 16 | Ht 65.0 in | Wt 203.0 lb

## 2015-06-11 DIAGNOSIS — R739 Hyperglycemia, unspecified: Secondary | ICD-10-CM

## 2015-06-11 DIAGNOSIS — I10 Essential (primary) hypertension: Secondary | ICD-10-CM | POA: Diagnosis not present

## 2015-06-11 DIAGNOSIS — J301 Allergic rhinitis due to pollen: Secondary | ICD-10-CM

## 2015-06-11 DIAGNOSIS — Z0000 Encounter for general adult medical examination without abnormal findings: Secondary | ICD-10-CM | POA: Diagnosis not present

## 2015-06-11 DIAGNOSIS — E038 Other specified hypothyroidism: Secondary | ICD-10-CM

## 2015-06-11 DIAGNOSIS — R0683 Snoring: Secondary | ICD-10-CM

## 2015-06-11 DIAGNOSIS — E669 Obesity, unspecified: Secondary | ICD-10-CM | POA: Insufficient documentation

## 2015-06-11 DIAGNOSIS — E7800 Pure hypercholesterolemia, unspecified: Secondary | ICD-10-CM

## 2015-06-11 DIAGNOSIS — E559 Vitamin D deficiency, unspecified: Secondary | ICD-10-CM

## 2015-06-11 LAB — CBC WITH DIFFERENTIAL/PLATELET
BASOS PCT: 1 % (ref 0.0–3.0)
Basophils Absolute: 0.1 10*3/uL (ref 0.0–0.1)
EOS ABS: 0.2 10*3/uL (ref 0.0–0.7)
EOS PCT: 2.1 % (ref 0.0–5.0)
HCT: 42.9 % (ref 36.0–46.0)
Hemoglobin: 14.5 g/dL (ref 12.0–15.0)
LYMPHS ABS: 2.8 10*3/uL (ref 0.7–4.0)
Lymphocytes Relative: 26.5 % (ref 12.0–46.0)
MCHC: 33.8 g/dL (ref 30.0–36.0)
MCV: 88.7 fl (ref 78.0–100.0)
MONO ABS: 0.6 10*3/uL (ref 0.1–1.0)
Monocytes Relative: 5.6 % (ref 3.0–12.0)
NEUTROS PCT: 64.8 % (ref 43.0–77.0)
Neutro Abs: 6.9 10*3/uL (ref 1.4–7.7)
PLATELETS: 369 10*3/uL (ref 150.0–400.0)
RBC: 4.83 Mil/uL (ref 3.87–5.11)
RDW: 13.5 % (ref 11.5–15.5)
WBC: 10.6 10*3/uL — AB (ref 4.0–10.5)

## 2015-06-11 LAB — COMPREHENSIVE METABOLIC PANEL
ALBUMIN: 4.5 g/dL (ref 3.5–5.2)
ALT: 17 U/L (ref 0–35)
AST: 13 U/L (ref 0–37)
Alkaline Phosphatase: 73 U/L (ref 39–117)
BUN: 15 mg/dL (ref 6–23)
CHLORIDE: 99 meq/L (ref 96–112)
CO2: 29 mEq/L (ref 19–32)
CREATININE: 0.91 mg/dL (ref 0.40–1.20)
Calcium: 9.8 mg/dL (ref 8.4–10.5)
GFR: 68.68 mL/min (ref >60.00)
GLUCOSE: 101 mg/dL — AB (ref 70–99)
POTASSIUM: 3.8 meq/L (ref 3.5–5.1)
SODIUM: 137 meq/L (ref 135–145)
TOTAL PROTEIN: 7 g/dL (ref 6.0–8.3)
Total Bilirubin: 0.5 mg/dL (ref 0.2–1.2)

## 2015-06-11 LAB — URINALYSIS, ROUTINE W REFLEX MICROSCOPIC
BILIRUBIN URINE: NEGATIVE
HGB URINE DIPSTICK: NEGATIVE
KETONES UR: NEGATIVE
Leukocytes, UA: NEGATIVE
NITRITE: NEGATIVE
RBC / HPF: NONE SEEN (ref 0–?)
Specific Gravity, Urine: 1.02 (ref 1.000–1.030)
TOTAL PROTEIN, URINE-UPE24: NEGATIVE
URINE GLUCOSE: NEGATIVE
UROBILINOGEN UA: 0.2 (ref 0.0–1.0)
pH: 5.5 (ref 5.0–8.0)

## 2015-06-11 LAB — LIPID PANEL
CHOLESTEROL: 226 mg/dL — AB (ref 0–200)
HDL: 44.7 mg/dL (ref >39.00)
LDL CALC: 153 mg/dL — AB (ref 0–99)
NONHDL: 180.95
Total CHOL/HDL Ratio: 5
Triglycerides: 139 mg/dL (ref 0.0–149.0)
VLDL: 27.8 mg/dL (ref 0.0–40.0)

## 2015-06-11 LAB — TSH: TSH: 6.05 u[IU]/mL — ABNORMAL HIGH (ref 0.35–4.50)

## 2015-06-11 LAB — VITAMIN D 25 HYDROXY (VIT D DEFICIENCY, FRACTURES): VITD: 22.13 ng/mL — AB (ref 30.00–100.00)

## 2015-06-11 LAB — MAGNESIUM: MAGNESIUM: 2 mg/dL (ref 1.5–2.5)

## 2015-06-11 LAB — HEMOGLOBIN A1C: HEMOGLOBIN A1C: 5.9 % (ref 4.6–6.5)

## 2015-06-11 MED ORDER — NALTREXONE-BUPROPION HCL ER 8-90 MG PO TB12: 2.0000 | ORAL_TABLET | Freq: Two times a day (BID) | ORAL | Status: DC

## 2015-06-11 MED ORDER — CETIRIZINE HCL 10 MG PO TABS: 10.0000 mg | ORAL_TABLET | Freq: Every day | ORAL | Status: DC

## 2015-06-11 MED ORDER — TELMISARTAN 80 MG PO TABS: 80.0000 mg | ORAL_TABLET | Freq: Every day | ORAL | Status: DC

## 2015-06-11 NOTE — Progress Notes (Signed)
Pre visit review using our clinic review tool, if applicable. No additional management support is needed unless otherwise documented below in the visit note. 

## 2015-06-11 NOTE — Patient Instructions (Signed)
Hypertension Hypertension, commonly called high blood pressure, is when the force of blood pumping through your arteries is too strong. Your arteries are the blood vessels that carry blood from your heart throughout your body. A blood pressure reading consists of a higher number over a lower number, such as 110/72. The higher number (systolic) is the pressure inside your arteries when your heart pumps. The lower number (diastolic) is the pressure inside your arteries when your heart relaxes. Ideally you want your blood pressure below 120/80. Hypertension forces your heart to work harder to pump blood. Your arteries may become narrow or stiff. Having untreated or uncontrolled hypertension can cause heart attack, stroke, kidney disease, and other problems. RISK FACTORS Some risk factors for high blood pressure are controllable. Others are not.  Risk factors you cannot control include:   Race. You may be at higher risk if you are African American.  Age. Risk increases with age.  Gender. Men are at higher risk than women before age 45 years. After age 65, women are at higher risk than men. Risk factors you can control include:  Not getting enough exercise or physical activity.  Being overweight.  Getting too much fat, sugar, calories, or salt in your diet.  Drinking too much alcohol. SIGNS AND SYMPTOMS Hypertension does not usually cause signs or symptoms. Extremely high blood pressure (hypertensive crisis) may cause headache, anxiety, shortness of breath, and nosebleed. DIAGNOSIS To check if you have hypertension, your health care provider will measure your blood pressure while you are seated, with your arm held at the level of your heart. It should be measured at least twice using the same arm. Certain conditions can cause a difference in blood pressure between your right and left arms. A blood pressure reading that is higher than normal on one occasion does not mean that you need treatment. If  it is not clear whether you have high blood pressure, you may be asked to return on a different day to have your blood pressure checked again. Or, you may be asked to monitor your blood pressure at home for 1 or more weeks. TREATMENT Treating high blood pressure includes making lifestyle changes and possibly taking medicine. Living a healthy lifestyle can help lower high blood pressure. You may need to change some of your habits. Lifestyle changes may include:  Following the DASH diet. This diet is high in fruits, vegetables, and whole grains. It is low in salt, red meat, and added sugars.  Keep your sodium intake below 2,300 mg per day.  Getting at least 30-45 minutes of aerobic exercise at least 4 times per week.  Losing weight if necessary.  Not smoking.  Limiting alcoholic beverages.  Learning ways to reduce stress. Your health care provider may prescribe medicine if lifestyle changes are not enough to get your blood pressure under control, and if one of the following is true:  You are 18-59 years of age and your systolic blood pressure is above 140.  You are 60 years of age or older, and your systolic blood pressure is above 150.  Your diastolic blood pressure is above 90.  You have diabetes, and your systolic blood pressure is over 140 or your diastolic blood pressure is over 90.  You have kidney disease and your blood pressure is above 140/90.  You have heart disease and your blood pressure is above 140/90. Your personal target blood pressure may vary depending on your medical conditions, your age, and other factors. HOME CARE INSTRUCTIONS    Have your blood pressure rechecked as directed by your health care provider.   Take medicines only as directed by your health care provider. Follow the directions carefully. Blood pressure medicines must be taken as prescribed. The medicine does not work as well when you skip doses. Skipping doses also puts you at risk for  problems.  Do not smoke.   Monitor your blood pressure at home as directed by your health care provider. SEEK MEDICAL CARE IF:   You think you are having a reaction to medicines taken.  You have recurrent headaches or feel dizzy.  You have swelling in your ankles.  You have trouble with your vision. SEEK IMMEDIATE MEDICAL CARE IF:  You develop a severe headache or confusion.  You have unusual weakness, numbness, or feel faint.  You have severe chest or abdominal pain.  You vomit repeatedly.  You have trouble breathing. MAKE SURE YOU:   Understand these instructions.  Will watch your condition.  Will get help right away if you are not doing well or get worse.   This information is not intended to replace advice given to you by your health care provider. Make sure you discuss any questions you have with your health care provider.   Document Released: 01/05/2005 Document Revised: 05/22/2014 Document Reviewed: 10/28/2012 Elsevier Interactive Patient Education 2016 [employer]  

## 2015-06-11 NOTE — Progress Notes (Signed)
Subjective:  Patient ID: Cynthia Craig, female    DOB: June 27, 1960  Age: 55 y.o. MRN: [id number]  CC: Hypertension; Annual Exam; and Hypothyroidism   HPI Cynthia Craig presents for a CPX.  She complains that her blood pressure has not been well controlled but she denies headache, blurred vision, chest pain, shortness of breath, edema, or fatigue. Her husband complains about her snoring and she may have sleep apnea. She tells me she has been compliant with hydrochlorothiazide and Bystolic but she has not been compliant with lifestyle modifications. She complains of difficulty losing weight though she has lost about 15 pounds over the last 2 months. She wants to try an oral medication to help her control her appetite.  Outpatient Prescriptions Prior to Visit  Medication Sig Dispense Refill  . albuterol (PROVENTIL HFA;VENTOLIN HFA) 108 (90 BASE) MCG/ACT inhaler Inhale 2 puffs into the lungs every 6 (six) hours as needed for wheezing or shortness of breath. 1 Inhaler 1  . Eluxadoline (VIBERZI) 75 MG TABS Take 1 tablet by mouth 2 (two) times daily. 60 tablet 5  . hydrochlorothiazide (HYDRODIURIL) 25 MG tablet Take 1 tablet (25 mg total) by mouth daily. 90 tablet 1  . mometasone (NASONEX) 50 MCG/ACT nasal spray Place 4 sprays into the nose daily. 17 g 12  . nebivolol (BYSTOLIC) 10 MG tablet Take 1 tablet (10 mg total) by mouth daily. 90 tablet 1  . cetirizine (ZYRTEC) 10 MG tablet Take 1 tablet (10 mg total) by mouth daily. 30 tablet 11   No facility-administered medications prior to visit.    ROS Review of Systems  Constitutional: Negative.  Negative for diaphoresis, fatigue and unexpected weight change.  HENT: Negative.   Eyes: Negative.  Negative for visual disturbance.  Respiratory: Positive for apnea. Negative for cough, choking, chest tightness, shortness of breath and stridor.   Cardiovascular: Negative.  Negative for chest pain, palpitations and leg swelling.  Gastrointestinal:  Negative.  Negative for nausea, vomiting, abdominal pain, diarrhea and constipation.  Endocrine: Negative.   Genitourinary: Negative.  Negative for dysuria, urgency, frequency, hematuria, decreased urine volume and difficulty urinating.  Musculoskeletal: Negative.  Negative for myalgias, back pain, joint swelling, arthralgias and neck pain.  Skin: Negative.  Negative for color change and rash.  Allergic/Immunologic: Negative.   Neurological: Negative.   Hematological: Negative.  Negative for adenopathy. Does not bruise/bleed easily.  Psychiatric/Behavioral: Negative.     Objective:  BP 160/104 mmHg  Pulse 77  Temp(Src) 98.4 F (36.9 C) (Oral)  Resp 16  Ht 5\' 5"  (1.651 m)  Wt 203 lb (92.08 kg)  BMI 33.78 kg/m2  SpO2 97%  BP Readings from Last 3 Encounters:  06/11/15 160/104  04/03/15 138/100  01/01/15 148/90    Wt Readings from Last 3 Encounters:  06/11/15 203 lb (92.08 kg)  04/03/15 218 lb (98.884 kg)  01/01/15 201 lb (91.173 kg)    Physical Exam  Constitutional: She is oriented to person, place, and time. She appears well-developed and well-nourished. No distress.  HENT:  Head: Normocephalic and atraumatic.  Mouth/Throat: Oropharynx is clear and moist. No oropharyngeal exudate.  Eyes: Conjunctivae are normal. Right eye exhibits no discharge. Left eye exhibits no discharge. No scleral icterus.  Neck: Normal range of motion. Neck supple. No JVD present. No tracheal deviation present. No thyromegaly present.  Cardiovascular: Normal rate, regular rhythm, normal heart sounds and intact distal pulses.  Exam reveals no gallop and no friction rub.   No murmur heard. Pulmonary/Chest: Effort  normal and breath sounds normal. No stridor. No respiratory distress. She has no wheezes. She has no rales. She exhibits no tenderness.  Abdominal: Soft. Bowel sounds are normal. She exhibits no distension and no mass. There is no tenderness. There is no rebound and no guarding.    Musculoskeletal: Normal range of motion. She exhibits no edema or tenderness.  Lymphadenopathy:    She has no cervical adenopathy.  Neurological: She is oriented to person, place, and time.  Skin: Skin is warm and dry. No rash noted. She is not diaphoretic. No erythema. No pallor.  Vitals reviewed.   Lab Results  Component Value Date   WBC 10.6* 06/11/2015   HGB 14.5 06/11/2015   HCT 42.9 06/11/2015   PLT 369.0 06/11/2015   GLUCOSE 101* 06/11/2015   CHOL 226* 06/11/2015   TRIG 139.0 06/11/2015   HDL 44.70 06/11/2015   LDLCALC 153* 06/11/2015   ALT 17 06/11/2015   AST 13 06/11/2015   NA 137 06/11/2015   K 3.8 06/11/2015   CL 99 06/11/2015   CREATININE 0.91 06/11/2015   BUN 15 06/11/2015   CO2 29 06/11/2015   TSH 6.05* 06/11/2015   HGBA1C 5.9 06/11/2015    No results found.  Assessment & Plan:   Cynthia Craig was seen today for hypertension, annual exam and hypothyroidism.  Diagnoses and all orders for this visit:  Essential hypertension- her blood pressure is not well controlled, she agrees to work on her lifestyle modifications, she will continue the diuretic as well as the beta blocker but I will also add on an ARB. We'll treat her vitamin D deficiency. Will refer for sleep study to screen and treat sleep apnea if indicated. -     CBC with Differential/Platelet; Future -     Magnesium; Future -     VITAMIN D 25 Hydroxy (Vit-D Deficiency, Fractures); Future -     telmisartan (MICARDIS) 80 MG tablet; Take 1 tablet (80 mg total) by mouth daily.  Other specified hypothyroidism- her TSH is gone up and she is symptomatic, I've asked her to start levothyroxine -     levothyroxine (SYNTHROID, LEVOTHROID) 50 MCG tablet; Take 1 tablet (50 mcg total) by mouth daily.  Hyperglycemia- she is prediabetic, she agrees to work on her lifestyle modifications, will start an oral medication to reduce her weight. -     Hemoglobin A1c; Future  Seasonal allergic rhinitis due to pollen -      cetirizine (ZYRTEC) 10 MG tablet; Take 1 tablet (10 mg total) by mouth daily.  Hyperglycemia -     Hemoglobin A1c; Future  Routine general medical examination at a health care facility- exam completed, labs ordered and reviewed, Pap/mammogram are up-to-date, colonoscopy is up-to-date, she was given patient education material as well. -     Lipid panel; Future -     Comprehensive metabolic panel; Future -     TSH; Future -     Urinalysis, Routine w reflex microscopic (not at [facility]); Future -     Hepatitis C antibody; Future -     HIV antibody; Future  Snoring -     Ambulatory referral to Pulmonology  Obesity (BMI 30.0-34.9)- in addition to lifestyle modifications she will start Contrave, I gave her a sample bottle and instructed her on the dosing escalation over the first 4 weeks. -     Naltrexone-Bupropion HCl ER (CONTRAVE) 8-90 MG TB12; Take 2 tablets by mouth 2 (two) times daily.  Vitamin D deficiency -  Cholecalciferol 50000 units TABS; Take 1 tablet by mouth once a week.  HYPERCHOLESTEROLEMIA- her Framingham risk score is only 5% so at this time I do not recommend that she take a statin.   I am having Cynthia Craig start on telmisartan, Naltrexone-Bupropion HCl ER, Cholecalciferol, and levothyroxine. I am also having her maintain her albuterol, Eluxadoline, hydrochlorothiazide, nebivolol, mometasone, and cetirizine.  Meds ordered this encounter  Medications  . cetirizine (ZYRTEC) 10 MG tablet    Sig: Take 1 tablet (10 mg total) by mouth daily.    Dispense:  90 tablet    Refill:  3  . telmisartan (MICARDIS) 80 MG tablet    Sig: Take 1 tablet (80 mg total) by mouth daily.    Dispense:  90 tablet    Refill:  1  . Naltrexone-Bupropion HCl ER (CONTRAVE) 8-90 MG TB12    Sig: Take 2 tablets by mouth 2 (two) times daily.    Dispense:  120 tablet    Refill:  3  . Cholecalciferol 50000 units TABS    Sig: Take 1 tablet by mouth once a week.    Dispense:  12 tablet    Refill:  3    . levothyroxine (SYNTHROID, LEVOTHROID) 50 MCG tablet    Sig: Take 1 tablet (50 mcg total) by mouth daily.    Dispense:  90 tablet    Refill:  1     Follow-up: Return in about 6 weeks (around 07/23/2015).  Scarlette Calico, MD

## 2015-06-12 ENCOUNTER — Encounter: Payer: Self-pay | Admitting: Internal Medicine

## 2015-06-12 DIAGNOSIS — E559 Vitamin D deficiency, unspecified: Secondary | ICD-10-CM | POA: Insufficient documentation

## 2015-06-12 LAB — HEPATITIS C ANTIBODY: HCV AB: NEGATIVE

## 2015-06-12 LAB — HIV ANTIBODY (ROUTINE TESTING W REFLEX): HIV 1&2 Ab, 4th Generation: NONREACTIVE

## 2015-06-12 MED ORDER — LEVOTHYROXINE SODIUM 50 MCG PO TABS: 50.0000 ug | ORAL_TABLET | Freq: Every day | ORAL | Status: DC

## 2015-06-12 MED ORDER — CHOLECALCIFEROL 1.25 MG (50000 UT) PO TABS: 1.0000 | ORAL_TABLET | ORAL | Status: DC

## 2015-08-06 ENCOUNTER — Encounter: Payer: Self-pay | Admitting: Internal Medicine

## 2015-08-06 ENCOUNTER — Ambulatory Visit (INDEPENDENT_AMBULATORY_CARE_PROVIDER_SITE_OTHER): Payer: 59 | Admitting: Internal Medicine

## 2015-08-06 ENCOUNTER — Other Ambulatory Visit (INDEPENDENT_AMBULATORY_CARE_PROVIDER_SITE_OTHER): Payer: 59

## 2015-08-06 VITALS — BP 118/82 | HR 80 | Temp 98.1°F | Resp 16 | Wt 200.0 lb

## 2015-08-06 DIAGNOSIS — E669 Obesity, unspecified: Secondary | ICD-10-CM | POA: Diagnosis not present

## 2015-08-06 DIAGNOSIS — I10 Essential (primary) hypertension: Secondary | ICD-10-CM

## 2015-08-06 DIAGNOSIS — E038 Other specified hypothyroidism: Secondary | ICD-10-CM

## 2015-08-06 LAB — COMPREHENSIVE METABOLIC PANEL
ALK PHOS: 86 U/L (ref 39–117)
ALT: 17 U/L (ref 0–35)
AST: 14 U/L (ref 0–37)
Albumin: 4.5 g/dL (ref 3.5–5.2)
BILIRUBIN TOTAL: 0.5 mg/dL (ref 0.2–1.2)
BUN: 12 mg/dL (ref 6–23)
CALCIUM: 10.1 mg/dL (ref 8.4–10.5)
CO2: 32 mEq/L (ref 19–32)
Chloride: 93 mEq/L — ABNORMAL LOW (ref 96–112)
Creatinine, Ser: 0.83 mg/dL (ref 0.40–1.20)
GFR: 76.33 mL/min (ref >60.00)
GLUCOSE: 119 mg/dL — AB (ref 70–99)
Potassium: 3.1 mEq/L — ABNORMAL LOW (ref 3.5–5.1)
Sodium: 134 mEq/L — ABNORMAL LOW (ref 135–145)
TOTAL PROTEIN: 7.6 g/dL (ref 6.0–8.3)

## 2015-08-06 LAB — TSH: TSH: 3.7 u[IU]/mL (ref 0.35–4.50)

## 2015-08-06 LAB — MAGNESIUM: Magnesium: 1.9 mg/dL (ref 1.5–2.5)

## 2015-08-06 MED ORDER — LORCASERIN HCL ER 20 MG PO TB24: 1.0000 | ORAL_TABLET | Freq: Every day | ORAL | Status: DC

## 2015-08-06 NOTE — Progress Notes (Signed)
Pre visit review using our clinic review tool, if applicable. No additional management support is needed unless otherwise documented below in the visit note. 

## 2015-08-06 NOTE — Patient Instructions (Signed)

## 2015-08-06 NOTE — Progress Notes (Signed)
Subjective:  Patient ID: Cynthia Craig, female    DOB: [birth year]  Age: 55 y.o. MRN: [id number]  CC: Hypertension   HPI CURRY GRUHLKE presents for a blood pressure check. She felt like the combination of hydrochlorothiazide, telmisartan, and nebivolol was causing her blood pressure to be low so she stopped taking the nebivolol. She was experiencing weakness in her legs. She also complains that she has started developing muscle cramps and twitches over the last few weeks.  She tried Contrave to help her lose weight but she said it caused nausea and vomiting. She wants to try something other than Contrave to help her lose weight. She is working on her lifestyle modifications with diet and exercise.   Outpatient Prescriptions Prior to Visit  Medication Sig Dispense Refill  . albuterol (PROVENTIL HFA;VENTOLIN HFA) 108 (90 BASE) MCG/ACT inhaler Inhale 2 puffs into the lungs every 6 (six) hours as needed for wheezing or shortness of breath. 1 Inhaler 1  . cetirizine (ZYRTEC) 10 MG tablet Take 1 tablet (10 mg total) by mouth daily. 90 tablet 3  . Cholecalciferol 50000 units TABS Take 1 tablet by mouth once a week. 12 tablet 3  . Eluxadoline (VIBERZI) 75 MG TABS Take 1 tablet by mouth 2 (two) times daily. 60 tablet 5  . levothyroxine (SYNTHROID, LEVOTHROID) 50 MCG tablet Take 1 tablet (50 mcg total) by mouth daily. 90 tablet 1  . mometasone (NASONEX) 50 MCG/ACT nasal spray Place 4 sprays into the nose daily. 17 g 12  . telmisartan (MICARDIS) 80 MG tablet Take 1 tablet (80 mg total) by mouth daily. 90 tablet 1  . hydrochlorothiazide (HYDRODIURIL) 25 MG tablet Take 1 tablet (25 mg total) by mouth daily. 90 tablet 1  . Naltrexone-Bupropion HCl ER (CONTRAVE) 8-90 MG TB12 Take 2 tablets by mouth 2 (two) times daily. (Patient not taking: Reported on 08/06/2015) 120 tablet 3  . nebivolol (BYSTOLIC) 10 MG tablet Take 1 tablet (10 mg total) by mouth daily. (Patient not taking: Reported on 08/06/2015) 90  tablet 1   No facility-administered medications prior to visit.    ROS Review of Systems  Constitutional: Negative for fever, chills, diaphoresis, appetite change and fatigue.  HENT: Negative.   Eyes: Negative.  Negative for visual disturbance.  Respiratory: Negative.  Negative for cough, choking, chest tightness, shortness of breath and stridor.   Cardiovascular: Negative.  Negative for chest pain, palpitations and leg swelling.  Gastrointestinal: Negative.  Negative for nausea, vomiting, abdominal pain and diarrhea.  Endocrine: Negative.   Genitourinary: Negative.  Negative for dysuria, urgency, frequency, decreased urine volume and difficulty urinating.  Musculoskeletal: Negative.  Negative for myalgias, back pain and neck pain.       Muscle cramps  Skin: Negative.  Negative for color change and rash.  Allergic/Immunologic: Negative.   Neurological: Positive for weakness. Negative for dizziness, tremors, numbness and headaches.  Hematological: Negative.  Negative for adenopathy. Does not bruise/bleed easily.  Psychiatric/Behavioral: Negative.     Objective:  BP 118/82 mmHg  Pulse 80  Temp(Src) 98.1 F (36.7 C)  Resp 16  Wt 200 lb (90.719 kg)  SpO2 97%  BP Readings from Last 3 Encounters:  08/06/15 118/82  06/11/15 160/104  04/03/15 138/100    Wt Readings from Last 3 Encounters:  08/06/15 200 lb (90.719 kg)  06/11/15 203 lb (92.08 kg)  04/03/15 218 lb (98.884 kg)    Physical Exam  Constitutional: She is oriented to person, place, and time. No distress.  HENT:  Mouth/Throat: Oropharynx is clear and moist. No oropharyngeal exudate.  Eyes: Conjunctivae are normal. Right eye exhibits no discharge. Left eye exhibits no discharge. No scleral icterus.  Neck: Normal range of motion. Neck supple. No JVD present. No tracheal deviation present. No thyromegaly present.  Cardiovascular: Normal rate, regular rhythm, normal heart sounds and intact distal pulses.  Exam reveals no  gallop and no friction rub.   No murmur heard. Pulmonary/Chest: Effort normal and breath sounds normal. No stridor. No respiratory distress. She has no wheezes. She has no rales. She exhibits no tenderness.  Abdominal: Soft. Bowel sounds are normal. She exhibits no distension and no mass. There is no tenderness. There is no rebound and no guarding.  Musculoskeletal: Normal range of motion. She exhibits no edema or tenderness.  Lymphadenopathy:    She has no cervical adenopathy.  Neurological: She is oriented to person, place, and time.  Skin: Skin is warm and dry. No rash noted. She is not diaphoretic. No erythema. No pallor.  Vitals reviewed.   Lab Results  Component Value Date   WBC 10.6* 06/11/2015   HGB 14.5 06/11/2015   HCT 42.9 06/11/2015   PLT 369.0 06/11/2015   GLUCOSE 119* 08/06/2015   CHOL 226* 06/11/2015   TRIG 139.0 06/11/2015   HDL 44.70 06/11/2015   LDLCALC 153* 06/11/2015   ALT 17 08/06/2015   AST 14 08/06/2015   NA 134* 08/06/2015   K 3.1* 08/06/2015   CL 93* 08/06/2015   CREATININE 0.83 08/06/2015   BUN 12 08/06/2015   CO2 32 08/06/2015   TSH 3.70 08/06/2015   HGBA1C 5.9 06/11/2015    No results found.  Assessment & Plan:   Kalen was seen today for hypertension.  Diagnoses and all orders for this visit:  Essential hypertension- Her blood pressure is well-controlled on telmisartan and hydrochlorothiazide bht she has developed hypokalemia, hyponatremia, and hypochloremia, this explains her muscle cramps and twitches. I've asked her to stop taking hydrochlorothiazide and over the next 3-4 days hydrate herself with several bottles of Gatorade each day. She will return in a few weeks for me to recheck her blood pressure. -     Comprehensive metabolic panel; Future -     Magnesium; Future  Obesity (BMI 30.0-34.9)- will discontinue Contrave due to the side effects, she will continue to work on her lifestyle modifications and will try Belviq to help her to  reduce her caloric intake. -     Lorcaserin HCl ER (BELVIQ XR) 20 MG TB24; Take 1 tablet by mouth daily.  Other specified hypothyroidism- her TSH is in the normal range now, will continue the current dose of levothyroxine. -     TSH; Future   I have discontinued Ms. Cohen's hydrochlorothiazide, nebivolol, and Naltrexone-Bupropion HCl ER. I am also having her start on Lorcaserin HCl ER. Additionally, I am having her maintain her albuterol, Eluxadoline, mometasone, cetirizine, telmisartan, Cholecalciferol, and levothyroxine.  Meds ordered this encounter  Medications  . Lorcaserin HCl ER (BELVIQ XR) 20 MG TB24    Sig: Take 1 tablet by mouth daily.    Dispense:  30 tablet    Refill:  5     Follow-up: Return in about 4 months (around 12/07/2015).  Scarlette Calico, MD

## 2015-09-02 ENCOUNTER — Encounter: Payer: Self-pay | Admitting: Internal Medicine

## 2015-09-02 ENCOUNTER — Other Ambulatory Visit (INDEPENDENT_AMBULATORY_CARE_PROVIDER_SITE_OTHER): Payer: 59

## 2015-09-02 ENCOUNTER — Ambulatory Visit (INDEPENDENT_AMBULATORY_CARE_PROVIDER_SITE_OTHER): Payer: 59 | Admitting: Internal Medicine

## 2015-09-02 VITALS — BP 124/80 | HR 76 | Temp 98.6°F | Resp 16 | Ht 65.0 in | Wt 202.0 lb

## 2015-09-02 DIAGNOSIS — I10 Essential (primary) hypertension: Secondary | ICD-10-CM | POA: Diagnosis not present

## 2015-09-02 LAB — BASIC METABOLIC PANEL
BUN: 11 mg/dL (ref 6–23)
CO2: 27 meq/L (ref 19–32)
Calcium: 9.8 mg/dL (ref 8.4–10.5)
Chloride: 100 mEq/L (ref 96–112)
Creatinine, Ser: 0.95 mg/dL (ref 0.40–1.20)
GFR: 65.3 mL/min (ref >60.00)
GLUCOSE: 109 mg/dL — AB (ref 70–99)
POTASSIUM: 3.9 meq/L (ref 3.5–5.1)
SODIUM: 137 meq/L (ref 135–145)

## 2015-09-02 NOTE — Patient Instructions (Signed)
Hypertension Hypertension, commonly called high blood pressure, is when the force of blood pumping through your arteries is too strong. Your arteries are the blood vessels that carry blood from your heart throughout your body. A blood pressure reading consists of a higher number over a lower number, such as 110/72. The higher number (systolic) is the pressure inside your arteries when your heart pumps. The lower number (diastolic) is the pressure inside your arteries when your heart relaxes. Ideally you want your blood pressure below 120/80. Hypertension forces your heart to work harder to pump blood. Your arteries may become narrow or stiff. Having untreated or uncontrolled hypertension can cause heart attack, stroke, kidney disease, and other problems. RISK FACTORS Some risk factors for high blood pressure are controllable. Others are not.  Risk factors you cannot control include:   Race. You may be at higher risk if you are African American.  Age. Risk increases with age.  Gender. Men are at higher risk than women before age 45 years. After age 65, women are at higher risk than men. Risk factors you can control include:  Not getting enough exercise or physical activity.  Being overweight.  Getting too much fat, sugar, calories, or salt in your diet.  Drinking too much alcohol. SIGNS AND SYMPTOMS Hypertension does not usually cause signs or symptoms. Extremely high blood pressure (hypertensive crisis) may cause headache, anxiety, shortness of breath, and nosebleed. DIAGNOSIS To check if you have hypertension, your health care provider will measure your blood pressure while you are seated, with your arm held at the level of your heart. It should be measured at least twice using the same arm. Certain conditions can cause a difference in blood pressure between your right and left arms. A blood pressure reading that is higher than normal on one occasion does not mean that you need treatment. If  it is not clear whether you have high blood pressure, you may be asked to return on a different day to have your blood pressure checked again. Or, you may be asked to monitor your blood pressure at home for 1 or more weeks. TREATMENT Treating high blood pressure includes making lifestyle changes and possibly taking medicine. Living a healthy lifestyle can help lower high blood pressure. You may need to change some of your habits. Lifestyle changes may include:  Following the DASH diet. This diet is high in fruits, vegetables, and whole grains. It is low in salt, red meat, and added sugars.  Keep your sodium intake below 2,300 mg per day.  Getting at least 30-45 minutes of aerobic exercise at least 4 times per week.  Losing weight if necessary.  Not smoking.  Limiting alcoholic beverages.  Learning ways to reduce stress. Your health care provider may prescribe medicine if lifestyle changes are not enough to get your blood pressure under control, and if one of the following is true:  You are 18-59 years of age and your systolic blood pressure is above 140.  You are 60 years of age or older, and your systolic blood pressure is above 150.  Your diastolic blood pressure is above 90.  You have diabetes, and your systolic blood pressure is over 140 or your diastolic blood pressure is over 90.  You have kidney disease and your blood pressure is above 140/90.  You have heart disease and your blood pressure is above 140/90. Your personal target blood pressure may vary depending on your medical conditions, your age, and other factors. HOME CARE INSTRUCTIONS    Have your blood pressure rechecked as directed by your health care provider.   Take medicines only as directed by your health care provider. Follow the directions carefully. Blood pressure medicines must be taken as prescribed. The medicine does not work as well when you skip doses. Skipping doses also puts you at risk for  problems.  Do not smoke.   Monitor your blood pressure at home as directed by your health care provider. SEEK MEDICAL CARE IF:   You think you are having a reaction to medicines taken.  You have recurrent headaches or feel dizzy.  You have swelling in your ankles.  You have trouble with your vision. SEEK IMMEDIATE MEDICAL CARE IF:  You develop a severe headache or confusion.  You have unusual weakness, numbness, or feel faint.  You have severe chest or abdominal pain.  You vomit repeatedly.  You have trouble breathing. MAKE SURE YOU:   Understand these instructions.  Will watch your condition.  Will get help right away if you are not doing well or get worse.   This information is not intended to replace advice given to you by your health care provider. Make sure you discuss any questions you have with your health care provider.   Document Released: 01/05/2005 Document Revised: 05/22/2014 Document Reviewed: 10/28/2012 Elsevier Interactive Patient Education 2016 [employer]  

## 2015-09-02 NOTE — Progress Notes (Signed)
Subjective:  Patient ID: Cynthia Craig, female    DOB: 22-Jan-1960  Age: 55 y.o. MRN: [id number]  CC: Hypertension   HPI Cynthia Craig presents for a blood pressure check. When I last saw her she was taking a thiazide diuretic and her labs showed hyponatremia and hypokalemia. She has since been controlling her blood pressure with an ARB as well as lifestyle modifications. She feels well today and offers no complaints.  Outpatient Medications Prior to Visit  Medication Sig Dispense Refill  . albuterol (PROVENTIL HFA;VENTOLIN HFA) 108 (90 BASE) MCG/ACT inhaler Inhale 2 puffs into the lungs every 6 (six) hours as needed for wheezing or shortness of breath. 1 Inhaler 1  . cetirizine (ZYRTEC) 10 MG tablet Take 1 tablet (10 mg total) by mouth daily. 90 tablet 3  . Cholecalciferol 50000 units TABS Take 1 tablet by mouth once a week. 12 tablet 3  . Eluxadoline (VIBERZI) 75 MG TABS Take 1 tablet by mouth 2 (two) times daily. 60 tablet 5  . levothyroxine (SYNTHROID, LEVOTHROID) 50 MCG tablet Take 1 tablet (50 mcg total) by mouth daily. 90 tablet 1  . Lorcaserin HCl ER (BELVIQ XR) 20 MG TB24 Take 1 tablet by mouth daily. 30 tablet 5  . mometasone (NASONEX) 50 MCG/ACT nasal spray Place 4 sprays into the nose daily. 17 g 12  . telmisartan (MICARDIS) 80 MG tablet Take 1 tablet (80 mg total) by mouth daily. 90 tablet 1   No facility-administered medications prior to visit.     ROS Review of Systems  Constitutional: Negative.  Negative for activity change, appetite change, diaphoresis, fatigue and unexpected weight change.  HENT: Negative.   Eyes: Negative for visual disturbance.  Respiratory: Negative.  Negative for cough, choking, chest tightness, shortness of breath and stridor.   Cardiovascular: Negative.  Negative for chest pain, palpitations and leg swelling.  Gastrointestinal: Negative.  Negative for abdominal pain, constipation, diarrhea, nausea and vomiting.  Endocrine: Negative.  Negative  for polydipsia, polyphagia and polyuria.  Genitourinary: Negative.  Negative for frequency and urgency.  Musculoskeletal: Negative.  Negative for back pain and neck pain.  Skin: Negative.  Negative for color change and rash.  Allergic/Immunologic: Negative.   Neurological: Negative.  Negative for dizziness, weakness, light-headedness and headaches.  Hematological: Negative.  Negative for adenopathy. Does not bruise/bleed easily.  Psychiatric/Behavioral: Negative.     Objective:  BP 124/80 (BP Location: Right Arm, Cuff Size: Large)   Pulse 76   Temp 98.6 F (37 C) (Oral)   Resp 16   Ht 5\' 5"  (1.651 m)   Wt 202 lb (91.6 kg)   SpO2 96%   BMI 33.61 kg/m   BP Readings from Last 3 Encounters:  09/02/15 124/80  08/06/15 118/82  06/11/15 (!) 160/104    Wt Readings from Last 3 Encounters:  09/02/15 202 lb (91.6 kg)  08/06/15 200 lb (90.7 kg)  06/11/15 203 lb (92.1 kg)    Physical Exam  Constitutional: She is oriented to person, place, and time. No distress.  HENT:  Head: Normocephalic and atraumatic.  Mouth/Throat: Oropharynx is clear and moist. No oropharyngeal exudate.  Eyes: Conjunctivae are normal. Right eye exhibits no discharge. Left eye exhibits no discharge. No scleral icterus.  Neck: Normal range of motion. Neck supple. No JVD present. No tracheal deviation present. No thyromegaly present.  Cardiovascular: Normal rate, regular rhythm, normal heart sounds and intact distal pulses.  Exam reveals no gallop and no friction rub.   No murmur heard.  Pulmonary/Chest: Effort normal and breath sounds normal. No stridor. No respiratory distress. She has no wheezes. She has no rales. She exhibits no tenderness.  Abdominal: Soft. Bowel sounds are normal. She exhibits no distension and no mass. There is no tenderness. There is no rebound and no guarding.  Musculoskeletal: Normal range of motion. She exhibits no edema, tenderness or deformity.  Lymphadenopathy:    She has no cervical  adenopathy.  Neurological: She is oriented to person, place, and time.  Skin: Skin is warm and dry. No rash noted. She is not diaphoretic. No erythema. No pallor.  Vitals reviewed.   Lab Results  Component Value Date   WBC 10.6 (H) 06/11/2015   HGB 14.5 06/11/2015   HCT 42.9 06/11/2015   PLT 369.0 06/11/2015   GLUCOSE 109 (H) 09/02/2015   CHOL 226 (H) 06/11/2015   TRIG 139.0 06/11/2015   HDL 44.70 06/11/2015   LDLCALC 153 (H) 06/11/2015   ALT 17 08/06/2015   AST 14 08/06/2015   NA 137 09/02/2015   K 3.9 09/02/2015   CL 100 09/02/2015   CREATININE 0.95 09/02/2015   BUN 11 09/02/2015   CO2 27 09/02/2015   TSH 3.70 08/06/2015   HGBA1C 5.9 06/11/2015    No results found.  Assessment & Plan:   Cynthia Craig was seen today for hypertension.  Diagnoses and all orders for this visit:  Essential hypertension- her blood pressure is well controlled and her electrolytes are normal now, will continue the ARB. -     Basic metabolic panel; Future   I have discontinued Cynthia Craig's hydrochlorothiazide. I am also having her maintain her albuterol, Eluxadoline, mometasone, cetirizine, telmisartan, Cholecalciferol, levothyroxine, and Lorcaserin HCl ER.  Meds ordered this encounter  Medications  . DISCONTD: hydrochlorothiazide (HYDRODIURIL) 25 MG tablet    Refill:  1     Follow-up: Return in about 4 months (around 01/02/2016).  Cynthia Calico, MD

## 2015-09-02 NOTE — Progress Notes (Signed)
Pre visit review using our clinic review tool, if applicable. No additional management support is needed unless otherwise documented below in the visit note. 

## 2015-09-04 ENCOUNTER — Institutional Professional Consult (permissible substitution): Payer: 59 | Admitting: Pulmonary Disease

## 2015-10-23 ENCOUNTER — Institutional Professional Consult (permissible substitution): Payer: 59 | Admitting: Pulmonary Disease

## 2015-10-28 ENCOUNTER — Encounter: Payer: Self-pay | Admitting: Internal Medicine

## 2015-10-28 ENCOUNTER — Ambulatory Visit (INDEPENDENT_AMBULATORY_CARE_PROVIDER_SITE_OTHER): Payer: 59 | Admitting: Internal Medicine

## 2015-10-28 ENCOUNTER — Ambulatory Visit (INDEPENDENT_AMBULATORY_CARE_PROVIDER_SITE_OTHER)
Admission: RE | Admit: 2015-10-28 | Discharge: 2015-10-28 | Disposition: A | Discharge status: Home / Self Care | Payer: 59 | Source: Ambulatory Visit | Attending: Internal Medicine | Admitting: Internal Medicine

## 2015-10-28 VITALS — BP 146/86 | HR 107 | Temp 97.2°F | Ht 65.0 in | Wt 201.0 lb

## 2015-10-28 DIAGNOSIS — S39012A Strain of muscle, fascia and tendon of lower back, initial encounter: Secondary | ICD-10-CM

## 2015-10-28 DIAGNOSIS — S3992XA Unspecified injury of lower back, initial encounter: Secondary | ICD-10-CM | POA: Diagnosis not present

## 2015-10-28 MED ORDER — ETODOLAC ER 600 MG PO TB24: 600.0000 mg | ORAL_TABLET | Freq: Every day | ORAL | 2 refills | Status: DC

## 2015-10-28 MED ORDER — METHOCARBAMOL 500 MG PO TABS: 500.0000 mg | ORAL_TABLET | Freq: Three times a day (TID) | ORAL | 0 refills | Status: DC | PRN

## 2015-10-28 NOTE — Progress Notes (Signed)
Pre visit review using our clinic review tool, if applicable. No additional management support is needed unless otherwise documented below in the visit note. 

## 2015-10-28 NOTE — Patient Instructions (Signed)

## 2015-10-29 NOTE — Progress Notes (Signed)
Subjective:  Patient ID: Cynthia Craig, female    DOB: 1960/08/24  Age: 55 y.o. MRN: [id number]  CC: Back Pain (Pt stated left side lower back pain for about 1 month.)   HPI Cynthia Craig presents for complaints of left lower back pain for about 6 weeks after [place] an airport. She had a ground-level fall after tripping over some luggage in baggage claim. This is the first time she has been seen about this. She describes an intermittent pain in her left lower back that does not radiate into her extremities. She describes a stiffness when she tries to stand from a sitting position and intermittent episodes of aching and soreness. She has tried over-the-counter doses of Motrin and a heating pad with some relief from the symptoms. She denies paresthesias in her lower extremities.  Outpatient Medications Prior to Visit  Medication Sig Dispense Refill  . albuterol (PROVENTIL HFA;VENTOLIN HFA) 108 (90 BASE) MCG/ACT inhaler Inhale 2 puffs into the lungs every 6 (six) hours as needed for wheezing or shortness of breath. 1 Inhaler 1  . cetirizine (ZYRTEC) 10 MG tablet Take 1 tablet (10 mg total) by mouth daily. 90 tablet 3  . Cholecalciferol 50000 units TABS Take 1 tablet by mouth once a week. 12 tablet 3  . Eluxadoline (VIBERZI) 75 MG TABS Take 1 tablet by mouth 2 (two) times daily. 60 tablet 5  . levothyroxine (SYNTHROID, LEVOTHROID) 50 MCG tablet Take 1 tablet (50 mcg total) by mouth daily. 90 tablet 1  . Lorcaserin HCl ER (BELVIQ XR) 20 MG TB24 Take 1 tablet by mouth daily. 30 tablet 5  . mometasone (NASONEX) 50 MCG/ACT nasal spray Place 4 sprays into the nose daily. 17 g 12  . telmisartan (MICARDIS) 80 MG tablet Take 1 tablet (80 mg total) by mouth daily. 90 tablet 1   No facility-administered medications prior to visit.     ROS Review of Systems  Constitutional: Negative for activity change, chills, fatigue and fever.  HENT: Negative.   Eyes: Negative.  Negative for visual  disturbance.  Respiratory: Negative for cough, choking, chest tightness, shortness of breath and stridor.   Cardiovascular: Negative.  Negative for chest pain, palpitations and leg swelling.  Gastrointestinal: Negative.  Negative for abdominal pain, constipation, diarrhea, nausea and vomiting.  Endocrine: Negative.   Genitourinary: Negative.   Musculoskeletal: Positive for back pain. Negative for arthralgias, joint swelling, myalgias and neck pain.  Skin: Negative.   Allergic/Immunologic: Negative.   Neurological: Negative.  Negative for dizziness, tremors, weakness, light-headedness, numbness and headaches.  Hematological: Negative for adenopathy. Does not bruise/bleed easily.  Psychiatric/Behavioral: Negative.     Objective:  BP (!) 146/86 (BP Location: Left Arm, Patient Position: Sitting, Cuff Size: Normal)   Pulse (!) 107   Temp 97.2 F (36.2 C)   Ht 5\' 5"  (1.651 m)   Wt 201 lb (91.2 kg)   SpO2 96%   BMI 33.45 kg/m   BP Readings from Last 3 Encounters:  10/28/15 (!) 146/86  09/02/15 124/80  08/06/15 118/82    Wt Readings from Last 3 Encounters:  10/28/15 201 lb (91.2 kg)  09/02/15 202 lb (91.6 kg)  08/06/15 200 lb (90.7 kg)    Physical Exam  Constitutional: She is oriented to person, place, and time. No distress.  HENT:  Mouth/Throat: Oropharynx is clear and moist. No oropharyngeal exudate.  Eyes: Conjunctivae are normal. Right eye exhibits no discharge. Left eye exhibits no discharge. No scleral icterus.  Neck:  Normal range of motion. Neck supple. No JVD present. No tracheal deviation present. No thyromegaly present.  Cardiovascular: Normal rate, regular rhythm, normal heart sounds and intact distal pulses.  Exam reveals no gallop and no friction rub.   No murmur heard. Pulmonary/Chest: Effort normal and breath sounds normal. No respiratory distress. She has no wheezes. She has no rales. She exhibits no tenderness.  Abdominal: Soft. Bowel sounds are normal. She  exhibits no distension and no mass. There is no tenderness. There is no rebound and no guarding.  Musculoskeletal: Normal range of motion. She exhibits no edema, tenderness or deformity.       Lumbar back: Normal. She exhibits normal range of motion, no tenderness, no bony tenderness, no swelling, no edema, no deformity, no pain and no spasm.  Lymphadenopathy:    She has no cervical adenopathy.  Neurological: She is oriented to person, place, and time. She has normal strength. She displays no atrophy, no tremor and normal reflexes. No cranial nerve deficit or sensory deficit. She exhibits normal muscle tone. She displays a negative Romberg sign. She displays no seizure activity. Coordination and gait normal.  Reflex Scores:      Tricep reflexes are 1+ on the right side and 1+ on the left side.      Bicep reflexes are 1+ on the right side and 1+ on the left side.      Brachioradialis reflexes are 1+ on the right side and 1+ on the left side.      Patellar reflexes are 1+ on the right side and 1+ on the left side.      Achilles reflexes are 0 on the right side and 0 on the left side. Neg SLR in BLE  Skin: Skin is warm and dry. No rash noted. She is not diaphoretic. No erythema. No pallor.  Vitals reviewed.   Lab Results  Component Value Date   WBC 10.6 (H) 06/11/2015   HGB 14.5 06/11/2015   HCT 42.9 06/11/2015   PLT 369.0 06/11/2015   GLUCOSE 109 (H) 09/02/2015   CHOL 226 (H) 06/11/2015   TRIG 139.0 06/11/2015   HDL 44.70 06/11/2015   LDLCALC 153 (H) 06/11/2015   ALT 17 08/06/2015   AST 14 08/06/2015   NA 137 09/02/2015   K 3.9 09/02/2015   CL 100 09/02/2015   CREATININE 0.95 09/02/2015   BUN 11 09/02/2015   CO2 27 09/02/2015   TSH 3.70 08/06/2015   HGBA1C 5.9 06/11/2015    Dg Lumbar Spine Complete  Result Date: 10/28/2015 CLINICAL DATA:  Left lower back pain since fall 6 weeks ago. EXAM: LUMBAR SPINE - COMPLETE 4+ VIEW COMPARISON:  None. FINDINGS: Degenerative facet disease  in the mid and lower lumbar spine. Disc spaces are maintained. No fracture. Slight anterolisthesis of L4 on L5 related to facet disease. SI joints are symmetric and unremarkable. IMPRESSION: Mild degenerative facet disease as above.  No acute findings. Electronically Signed   By: Rolm Baptise M.D.   On: 10/28/2015 12:54    Assessment & Plan:   Phylis was seen today for back pain.  Diagnoses and all orders for this visit:  Lower back injury, initial encounter- plain films are negative for fracture -     etodolac (LODINE XL) 600 MG 24 hr tablet; Take 1 tablet (600 mg total) by mouth daily. -     methocarbamol (ROBAXIN) 500 MG tablet; Take 1 tablet (500 mg total) by mouth every 8 (eight) hours as needed for muscle  spasms. -     DG Lumbar Spine Complete; Future  Lumbosacral strain, initial encounter- will treat with high-dose once daily anti-inflammatory and will try a muscle relaxer as well for symptom relief. If her symptoms do not imrpove in the next 2 weeks then I will refer her to physical therapy. -     etodolac (LODINE XL) 600 MG 24 hr tablet; Take 1 tablet (600 mg total) by mouth daily. -     methocarbamol (ROBAXIN) 500 MG tablet; Take 1 tablet (500 mg total) by mouth every 8 (eight) hours as needed for muscle spasms. -     DG Lumbar Spine Complete; Future   I am having Ms. Harlin start on etodolac and methocarbamol. I am also having her maintain her albuterol, Eluxadoline, mometasone, cetirizine, telmisartan, Cholecalciferol, levothyroxine, and Lorcaserin HCl ER.  Meds ordered this encounter  Medications  . etodolac (LODINE XL) 600 MG 24 hr tablet    Sig: Take 1 tablet (600 mg total) by mouth daily.    Dispense:  30 tablet    Refill:  2  . methocarbamol (ROBAXIN) 500 MG tablet    Sig: Take 1 tablet (500 mg total) by mouth every 8 (eight) hours as needed for muscle spasms.    Dispense:  90 tablet    Refill:  0     Follow-up: Return in about 6 weeks (around 12/09/2015).  Scarlette Calico, MD

## 2015-11-08 ENCOUNTER — Other Ambulatory Visit: Payer: Self-pay | Admitting: Obstetrics and Gynecology

## 2015-11-08 DIAGNOSIS — E041 Nontoxic single thyroid nodule: Secondary | ICD-10-CM

## 2015-11-15 ENCOUNTER — Ambulatory Visit
Admission: RE | Admit: 2015-11-15 | Discharge: 2015-11-15 | Disposition: A | Discharge status: Home / Self Care | Payer: 59 | Source: Ambulatory Visit | Attending: Obstetrics and Gynecology | Admitting: Obstetrics and Gynecology

## 2015-11-15 DIAGNOSIS — E041 Nontoxic single thyroid nodule: Secondary | ICD-10-CM

## 2015-12-19 ENCOUNTER — Encounter: Payer: Self-pay | Admitting: Internal Medicine

## 2015-12-19 ENCOUNTER — Ambulatory Visit (INDEPENDENT_AMBULATORY_CARE_PROVIDER_SITE_OTHER): Payer: 59 | Admitting: Internal Medicine

## 2015-12-19 VITALS — BP 146/92 | HR 102 | Temp 98.4°F | Resp 16 | Ht 65.0 in | Wt 202.1 lb

## 2015-12-19 DIAGNOSIS — J0100 Acute maxillary sinusitis, unspecified: Secondary | ICD-10-CM

## 2015-12-19 DIAGNOSIS — J301 Allergic rhinitis due to pollen: Secondary | ICD-10-CM | POA: Diagnosis not present

## 2015-12-19 MED ORDER — CEFDINIR 300 MG PO CAPS: 300.0000 mg | ORAL_CAPSULE | Freq: Two times a day (BID) | ORAL | 1 refills | Status: AC

## 2015-12-19 MED ORDER — METHYLPREDNISOLONE 4 MG PO TBPK: ORAL_TABLET | ORAL | 0 refills | Status: DC

## 2015-12-19 NOTE — Patient Instructions (Signed)

## 2015-12-20 ENCOUNTER — Encounter: Payer: Self-pay | Admitting: Internal Medicine

## 2015-12-20 MED ORDER — PROMETHAZINE-DM 6.25-15 MG/5ML PO SYRP: 5.0000 mL | ORAL_SOLUTION | Freq: Four times a day (QID) | ORAL | 0 refills | Status: DC | PRN

## 2015-12-20 NOTE — Progress Notes (Signed)
Subjective:  Patient ID: Cynthia Craig, female    DOB: August 17, 1960  Age: 55 y.o. MRN: [id number]  CC: Sinusitis (x5 days, head pressure, drainage,coricidin does not seem to help, ear pressure)   HPI IASHA RENEW presents for a 5 day history of sinus pain, nasal mucus, cough productive of yellow phlegm, sore throat, and chills but no fever, night sweats, chest pain, wheezing, or hemoptysis.  Outpatient Medications Prior to Visit  Medication Sig Dispense Refill  . albuterol (PROVENTIL HFA;VENTOLIN HFA) 108 (90 BASE) MCG/ACT inhaler Inhale 2 puffs into the lungs every 6 (six) hours as needed for wheezing or shortness of breath. 1 Inhaler 1  . cetirizine (ZYRTEC) 10 MG tablet Take 1 tablet (10 mg total) by mouth daily. 90 tablet 3  . Cholecalciferol 50000 units TABS Take 1 tablet by mouth once a week. 12 tablet 3  . Eluxadoline (VIBERZI) 75 MG TABS Take 1 tablet by mouth 2 (two) times daily. (Patient taking differently: Take 1 tablet by mouth 2 (two) times daily as needed. ) 60 tablet 5  . etodolac (LODINE XL) 600 MG 24 hr tablet Take 1 tablet (600 mg total) by mouth daily. 30 tablet 2  . levothyroxine (SYNTHROID, LEVOTHROID) 50 MCG tablet Take 1 tablet (50 mcg total) by mouth daily. 90 tablet 1  . methocarbamol (ROBAXIN) 500 MG tablet Take 1 tablet (500 mg total) by mouth every 8 (eight) hours as needed for muscle spasms. 90 tablet 0  . mometasone (NASONEX) 50 MCG/ACT nasal spray Place 4 sprays into the nose daily. 17 g 12  . telmisartan (MICARDIS) 80 MG tablet Take 1 tablet (80 mg total) by mouth daily. 90 tablet 1  . Lorcaserin HCl ER (BELVIQ XR) 20 MG TB24 Take 1 tablet by mouth daily. (Patient not taking: Reported on 12/19/2015) 30 tablet 5   No facility-administered medications prior to visit.     ROS Review of Systems  Constitutional: Positive for chills. Negative for fatigue and fever.  HENT: Positive for congestion, postnasal drip, rhinorrhea, sinus pain, sinus pressure and sore  throat. Negative for facial swelling, sneezing and trouble swallowing.   Eyes: Negative.   Respiratory: Positive for cough. Negative for choking, chest tightness, shortness of breath, wheezing and stridor.   Cardiovascular: Negative for chest pain and leg swelling.  Gastrointestinal: Negative.  Negative for abdominal pain, constipation, diarrhea, nausea and vomiting.  Endocrine: Negative.   Genitourinary: Negative.   Musculoskeletal: Negative.   Skin: Negative.  Negative for rash.  Allergic/Immunologic: Negative.   Neurological: Negative.   Hematological: Negative.  Negative for adenopathy. Does not bruise/bleed easily.  Psychiatric/Behavioral: Negative.     Objective:  BP (!) 146/92 (BP Location: Right Arm, Patient Position: Sitting, Cuff Size: Large)   Pulse (!) 102   Temp 98.4 F (36.9 C)   Ht 5\' 5"  (1.651 m)   Wt 202 lb 1.3 oz (91.7 kg)   SpO2 97%   BMI 33.63 kg/m   BP Readings from Last 3 Encounters:  12/19/15 (!) 146/92  10/28/15 (!) 146/86  09/02/15 124/80    Wt Readings from Last 3 Encounters:  12/19/15 202 lb 1.3 oz (91.7 kg)  10/28/15 201 lb (91.2 kg)  09/02/15 202 lb (91.6 kg)    Physical Exam  Constitutional: She is oriented to person, place, and time. No distress.  HENT:  Right Ear: Hearing, tympanic membrane, external ear and ear canal normal.  Left Ear: Hearing, tympanic membrane, external ear and ear canal normal.  Nose: Mucosal  edema and rhinorrhea present. No epistaxis. Right sinus exhibits maxillary sinus tenderness. Right sinus exhibits no frontal sinus tenderness. Left sinus exhibits maxillary sinus tenderness. Left sinus exhibits no frontal sinus tenderness.  Mouth/Throat: Oropharynx is clear and moist. No oropharyngeal exudate.  Eyes: Conjunctivae are normal. Right eye exhibits no discharge. Left eye exhibits no discharge. No scleral icterus.  Neck: Normal range of motion. Neck supple. No JVD present. No tracheal deviation present. No thyromegaly  present.  Cardiovascular: Normal rate, regular rhythm, normal heart sounds and intact distal pulses.  Exam reveals no gallop and no friction rub.   No murmur heard. Pulmonary/Chest: Effort normal and breath sounds normal. No stridor. No respiratory distress. She has no wheezes. She has no rales. She exhibits no tenderness.  Abdominal: Soft. Bowel sounds are normal. She exhibits no distension and no mass. There is no tenderness. There is no rebound and no guarding.  Musculoskeletal: Normal range of motion. She exhibits no edema, tenderness or deformity.  Lymphadenopathy:    She has no cervical adenopathy.  Neurological: She is oriented to person, place, and time.  Skin: Skin is warm and dry. No rash noted. She is not diaphoretic. No erythema. No pallor.  Vitals reviewed.   Lab Results  Component Value Date   WBC 10.6 (H) 06/11/2015   HGB 14.5 06/11/2015   HCT 42.9 06/11/2015   PLT 369.0 06/11/2015   GLUCOSE 109 (H) 09/02/2015   CHOL 226 (H) 06/11/2015   TRIG 139.0 06/11/2015   HDL 44.70 06/11/2015   LDLCALC 153 (H) 06/11/2015   ALT 17 08/06/2015   AST 14 08/06/2015   NA 137 09/02/2015   K 3.9 09/02/2015   CL 100 09/02/2015   CREATININE 0.95 09/02/2015   BUN 11 09/02/2015   CO2 27 09/02/2015   TSH 3.70 08/06/2015   HGBA1C 5.9 06/11/2015    US Thyroid  Result Date: 11/15/2015 CLINICAL DATA:  Palpable abnormality. Left thyroid nodule noted on physical exam EXAM: THYROID ULTRASOUND TECHNIQUE: Ultrasound examination of the thyroid gland and adjacent soft tissues was performed. COMPARISON:  11/03/2012 FINDINGS: Parenchymal Echotexture: Moderately heterogenous Estimated total number of nodules >/= 1 cm: 0 Number of spongiform nodules >/=  2 cm not described below (TR1): 0 Number of mixed cystic and solid nodules >/= 1.5 cm not described below (TR2): 0 _________________________________________________________ Isthmus: 0.3 cm, previously 0.2 cm No discrete nodules are identified within  the thyroid isthmus. _________________________________________________________ Right lobe: 5.0 x 2.3 x 2.5 cm, previously 5.2 x 2.4 x 2.5 cm No discrete nodules are identified within the right lobe of the thyroid. _________________________________________________________ Left lobe: 6.9 x 2.8 x 2.5 cm, previously 5.8 x 2.6 x 2.6 cm No discrete nodules are identified within the left lobe of the thyroid. IMPRESSION: The gland remains enlarged and heterogeneous without focal nodule. No biopsy or follow-up is recommended at this time. The above is in keeping with the ACR TI-RADS recommendations - J Am Coll Radiol 2017;14:587-595. Electronically Signed   By: Marybelle Killings M.D.   On: 11/15/2015 13:15    Assessment & Plan:   Pinar was seen today for sinusitis.  Diagnoses and all orders for this visit:  Acute seasonal allergic rhinitis due to pollen- She is experiencing a flareup of her symptoms so I have advised her take a Medrol Dosepak. -     methylPREDNISolone (MEDROL DOSEPAK) 4 MG TBPK tablet; TAKE AS DIRECTED -     promethazine-dextromethorphan (PROMETHAZINE-DM) 6.25-15 MG/5ML syrup; Take 5 mLs by mouth 4 (four) times  daily as needed for cough.  Acute maxillary sinusitis, recurrence not specified- I will treat the infection with a cephalosporin antibiotic and will control her symptoms with Phenergan DM. -     cefdinir (OMNICEF) 300 MG capsule; Take 1 capsule (300 mg total) by mouth 2 (two) times daily. -     promethazine-dextromethorphan (PROMETHAZINE-DM) 6.25-15 MG/5ML syrup; Take 5 mLs by mouth 4 (four) times daily as needed for cough.   I have discontinued Ms. Funes's Lorcaserin HCl ER. I am also having her start on methylPREDNISolone, cefdinir, and promethazine-dextromethorphan. Additionally, I am having her maintain her albuterol, Eluxadoline, mometasone, cetirizine, telmisartan, Cholecalciferol, levothyroxine, etodolac, and methocarbamol.  Meds ordered this encounter  Medications  .  methylPREDNISolone (MEDROL DOSEPAK) 4 MG TBPK tablet    Sig: TAKE AS DIRECTED    Dispense:  21 tablet    Refill:  0  . cefdinir (OMNICEF) 300 MG capsule    Sig: Take 1 capsule (300 mg total) by mouth 2 (two) times daily.    Dispense:  20 capsule    Refill:  1  . promethazine-dextromethorphan (PROMETHAZINE-DM) 6.25-15 MG/5ML syrup    Sig: Take 5 mLs by mouth 4 (four) times daily as needed for cough.    Dispense:  118 mL    Refill:  0     Follow-up: Return in about 3 weeks (around 01/09/2016).  Scarlette Calico, MD

## 2015-12-22 ENCOUNTER — Encounter: Payer: Self-pay | Admitting: Internal Medicine

## 2015-12-30 ENCOUNTER — Other Ambulatory Visit: Payer: Self-pay | Admitting: Internal Medicine

## 2016-02-11 ENCOUNTER — Telehealth: Payer: Self-pay

## 2016-02-11 DIAGNOSIS — E038 Other specified hypothyroidism: Secondary | ICD-10-CM

## 2016-02-11 DIAGNOSIS — I10 Essential (primary) hypertension: Secondary | ICD-10-CM

## 2016-02-11 DIAGNOSIS — J301 Allergic rhinitis due to pollen: Secondary | ICD-10-CM

## 2016-02-11 MED ORDER — TELMISARTAN 80 MG PO TABS: 80.0000 mg | ORAL_TABLET | Freq: Every day | ORAL | 1 refills | Status: DC

## 2016-02-11 MED ORDER — LEVOTHYROXINE SODIUM 50 MCG PO TABS: 50.0000 ug | ORAL_TABLET | Freq: Every day | ORAL | 1 refills | Status: DC

## 2016-02-11 MED ORDER — CETIRIZINE HCL 10 MG PO TABS
10.0000 mg | ORAL_TABLET | Freq: Every day | ORAL | 3 refills | Status: DC
Start: 2016-02-11 — End: 2017-08-12

## 2016-02-11 NOTE — Telephone Encounter (Signed)
Pt called and stated that she has a change in insurance and a change to her pharmacy. Pharmacy updated.  erx sent per pt rq.

## 2016-06-23 ENCOUNTER — Encounter: Payer: Self-pay | Admitting: Internal Medicine

## 2016-06-23 ENCOUNTER — Ambulatory Visit (INDEPENDENT_AMBULATORY_CARE_PROVIDER_SITE_OTHER): Payer: BLUE CROSS/BLUE SHIELD | Admitting: Internal Medicine

## 2016-06-23 ENCOUNTER — Other Ambulatory Visit (INDEPENDENT_AMBULATORY_CARE_PROVIDER_SITE_OTHER): Payer: BLUE CROSS/BLUE SHIELD

## 2016-06-23 VITALS — BP 170/98 | HR 97 | Temp 97.5°F | Resp 16 | Wt 192.0 lb

## 2016-06-23 DIAGNOSIS — R739 Hyperglycemia, unspecified: Secondary | ICD-10-CM | POA: Diagnosis not present

## 2016-06-23 DIAGNOSIS — I10 Essential (primary) hypertension: Secondary | ICD-10-CM

## 2016-06-23 DIAGNOSIS — E038 Other specified hypothyroidism: Secondary | ICD-10-CM | POA: Diagnosis not present

## 2016-06-23 DIAGNOSIS — R0683 Snoring: Secondary | ICD-10-CM | POA: Diagnosis not present

## 2016-06-23 DIAGNOSIS — E7800 Pure hypercholesterolemia, unspecified: Secondary | ICD-10-CM

## 2016-06-23 LAB — COMPREHENSIVE METABOLIC PANEL
ALBUMIN: 4.7 g/dL (ref 3.5–5.2)
ALT: 16 U/L (ref 0–35)
AST: 13 U/L (ref 0–37)
Alkaline Phosphatase: 84 U/L (ref 39–117)
BILIRUBIN TOTAL: 0.3 mg/dL (ref 0.2–1.2)
BUN: 14 mg/dL (ref 6–23)
CALCIUM: 9.9 mg/dL (ref 8.4–10.5)
CO2: 29 mEq/L (ref 19–32)
CREATININE: 0.96 mg/dL (ref 0.40–1.20)
Chloride: 102 mEq/L (ref 96–112)
GFR: 64.32 mL/min (ref >60.00)
Glucose, Bld: 101 mg/dL — ABNORMAL HIGH (ref 70–99)
Potassium: 4 mEq/L (ref 3.5–5.1)
Sodium: 140 mEq/L (ref 135–145)
TOTAL PROTEIN: 7.5 g/dL (ref 6.0–8.3)

## 2016-06-23 LAB — HEMOGLOBIN A1C: HEMOGLOBIN A1C: 5.8 % (ref 4.6–6.5)

## 2016-06-23 LAB — LIPID PANEL
CHOL/HDL RATIO: 5
Cholesterol: 213 mg/dL — ABNORMAL HIGH (ref 0–200)
HDL: 46.3 mg/dL (ref >39.00)
NONHDL: 167.02
TRIGLYCERIDES: 223 mg/dL — AB (ref 0.0–149.0)
VLDL: 44.6 mg/dL — ABNORMAL HIGH (ref 0.0–40.0)

## 2016-06-23 LAB — LDL CHOLESTEROL, DIRECT: Direct LDL: 143 mg/dL

## 2016-06-23 LAB — CBC WITH DIFFERENTIAL/PLATELET
BASOS ABS: 0.1 10*3/uL (ref 0.0–0.1)
Basophils Relative: 0.8 % (ref 0.0–3.0)
EOS ABS: 0.2 10*3/uL (ref 0.0–0.7)
Eosinophils Relative: 2.2 % (ref 0.0–5.0)
HEMATOCRIT: 41.4 % (ref 36.0–46.0)
HEMOGLOBIN: 14 g/dL (ref 12.0–15.0)
LYMPHS PCT: 30.9 % (ref 12.0–46.0)
Lymphs Abs: 3.3 10*3/uL (ref 0.7–4.0)
MCHC: 33.7 g/dL (ref 30.0–36.0)
MCV: 87.4 fl (ref 78.0–100.0)
Monocytes Absolute: 0.7 10*3/uL (ref 0.1–1.0)
Monocytes Relative: 6.7 % (ref 3.0–12.0)
Neutro Abs: 6.4 10*3/uL (ref 1.4–7.7)
Neutrophils Relative %: 59.4 % (ref 43.0–77.0)
PLATELETS: 356 10*3/uL (ref 150.0–400.0)
RBC: 4.73 Mil/uL (ref 3.87–5.11)
RDW: 13.3 % (ref 11.5–15.5)
WBC: 10.8 10*3/uL — AB (ref 4.0–10.5)

## 2016-06-23 LAB — URINALYSIS, ROUTINE W REFLEX MICROSCOPIC
BILIRUBIN URINE: NEGATIVE
HGB URINE DIPSTICK: NEGATIVE
KETONES UR: NEGATIVE
Leukocytes, UA: NEGATIVE
Nitrite: NEGATIVE
PH: 7 (ref 5.0–8.0)
Specific Gravity, Urine: <=1.005 — AB (ref 1.000–1.030)
TOTAL PROTEIN, URINE-UPE24: NEGATIVE
URINE GLUCOSE: NEGATIVE
UROBILINOGEN UA: 0.2 (ref 0.0–1.0)

## 2016-06-23 LAB — TSH: TSH: 6.09 u[IU]/mL — AB (ref 0.35–4.50)

## 2016-06-23 MED ORDER — CHLORTHALIDONE 25 MG PO TABS: 25.0000 mg | ORAL_TABLET | Freq: Every day | ORAL | 1 refills | Status: DC

## 2016-06-23 MED ORDER — TELMISARTAN 80 MG PO TABS: 80.0000 mg | ORAL_TABLET | Freq: Every day | ORAL | 1 refills | Status: AC

## 2016-06-23 NOTE — Patient Instructions (Signed)

## 2016-06-23 NOTE — Progress Notes (Signed)
Subjective:  Patient ID: Cynthia Craig, female    DOB: 05-08-1960  Age: 56 y.o. MRN: [id number]  CC: Hypertension; Hypothyroidism; and Hyperlipidemia   HPI Cynthia Craig presents for f/up - She complains that her blood pressure has not been well controlled and she has had intermittent headaches. She also tells me that she snores so loudly that sometimes her husband has to sleep in a different room. She denies any recent episodes of blurred vision, chest pain, shortness of breath, palpitations, edema, or fatigue. She occasionally takes Motrin for pain. She has been able to lose weight with diet and exercise.  Outpatient Medications Prior to Visit  Medication Sig Dispense Refill  . albuterol (PROVENTIL HFA;VENTOLIN HFA) 108 (90 BASE) MCG/ACT inhaler Inhale 2 puffs into the lungs every 6 (six) hours as needed for wheezing or shortness of breath. 1 Inhaler 1  . cetirizine (ZYRTEC) 10 MG tablet Take 1 tablet (10 mg total) by mouth daily. 90 tablet 3  . Cholecalciferol 50000 units TABS Take 1 tablet by mouth once a week. 12 tablet 3  . Eluxadoline (VIBERZI) 75 MG TABS Take 1 tablet by mouth 2 (two) times daily. (Patient taking differently: Take 1 tablet by mouth 2 (two) times daily as needed. ) 60 tablet 5  . mometasone (NASONEX) 50 MCG/ACT nasal spray Place 4 sprays into the nose daily. 17 g 12  . benzonatate (TESSALON) 200 MG capsule TAKE ONE CAPSULE BY MOUTH THREE TIMES DAILY AS NEEDED FOR COUGH 21 capsule 0  . etodolac (LODINE XL) 600 MG 24 hr tablet Take 1 tablet (600 mg total) by mouth daily. 30 tablet 2  . levothyroxine (SYNTHROID, LEVOTHROID) 50 MCG tablet Take 1 tablet (50 mcg total) by mouth daily. 90 tablet 1  . methocarbamol (ROBAXIN) 500 MG tablet Take 1 tablet (500 mg total) by mouth every 8 (eight) hours as needed for muscle spasms. 90 tablet 0  . methylPREDNISolone (MEDROL DOSEPAK) 4 MG TBPK tablet TAKE AS DIRECTED 21 tablet 0  . promethazine-dextromethorphan (PROMETHAZINE-DM)  6.25-15 MG/5ML syrup Take 5 mLs by mouth 4 (four) times daily as needed for cough. 118 mL 0  . telmisartan (MICARDIS) 80 MG tablet Take 1 tablet (80 mg total) by mouth daily. 90 tablet 1   No facility-administered medications prior to visit.     ROS Review of Systems  Constitutional: Negative.  Negative for appetite change, diaphoresis, fatigue and unexpected weight change.  HENT: Negative.   Eyes: Negative for visual disturbance.  Respiratory: Positive for apnea. Negative for cough, chest tightness, shortness of breath and wheezing.   Cardiovascular: Negative.  Negative for chest pain, palpitations and leg swelling.  Gastrointestinal: Negative for abdominal pain, constipation, diarrhea, nausea and vomiting.  Endocrine: Negative.  Negative for cold intolerance and heat intolerance.  Genitourinary: Negative.  Negative for difficulty urinating, dysuria, frequency and hematuria.  Musculoskeletal: Negative.   Skin: Negative.   Allergic/Immunologic: Negative.   Neurological: Positive for headaches. Negative for dizziness, speech difficulty, weakness and light-headedness.  Hematological: Negative for adenopathy. Does not bruise/bleed easily.  Psychiatric/Behavioral: Negative.     Objective:  BP (!) 170/98   Pulse 97   Temp 97.5 F (36.4 C)   Resp 16   Wt 192 lb (87.1 kg)   SpO2 99%   BMI 31.95 kg/m   BP Readings from Last 3 Encounters:  06/23/16 (!) 170/98  12/19/15 (!) 146/92  10/28/15 (!) 146/86    Wt Readings from Last 3 Encounters:  06/23/16 192 lb (87.1  kg)  12/19/15 202 lb 1.3 oz (91.7 kg)  10/28/15 201 lb (91.2 kg)    Physical Exam  Constitutional: She is oriented to person, place, and time. No distress.  HENT:  Mouth/Throat: Oropharynx is clear and moist. No oropharyngeal exudate.  Eyes: Conjunctivae and EOM are normal. Right eye exhibits no discharge. Left eye exhibits no discharge. No scleral icterus.  Neck: Normal range of motion. Neck supple. No JVD present.  No thyromegaly present.  Cardiovascular: Normal rate and regular rhythm.  Exam reveals no gallop.   No murmur heard. Pulmonary/Chest: Breath sounds normal. No respiratory distress. She has no rales.  Abdominal: Soft. Bowel sounds are normal. She exhibits no mass. There is no tenderness.  Musculoskeletal: Normal range of motion. She exhibits no edema, tenderness or deformity.  Neurological: She is alert and oriented to person, place, and time. She has normal reflexes. She displays normal reflexes. No cranial nerve deficit. She exhibits normal muscle tone. Coordination normal.  Skin: Skin is warm and dry. No rash noted. She is not diaphoretic. No erythema. No pallor.  Vitals reviewed.   Lab Results  Component Value Date   WBC 10.8 (H) 06/23/2016   HGB 14.0 06/23/2016   HCT 41.4 06/23/2016   PLT 356.0 06/23/2016   GLUCOSE 101 (H) 06/23/2016   CHOL 213 (H) 06/23/2016   TRIG 223.0 (H) 06/23/2016   HDL 46.30 06/23/2016   LDLDIRECT 143.0 06/23/2016   LDLCALC 153 (H) 06/11/2015   ALT 16 06/23/2016   AST 13 06/23/2016   NA 140 06/23/2016   K 4.0 06/23/2016   CL 102 06/23/2016   CREATININE 0.96 06/23/2016   BUN 14 06/23/2016   CO2 29 06/23/2016   TSH 6.09 (H) 06/23/2016   HGBA1C 5.8 06/23/2016    US Thyroid  Result Date: 11/15/2015 CLINICAL DATA:  Palpable abnormality. Left thyroid nodule noted on physical exam EXAM: THYROID ULTRASOUND TECHNIQUE: Ultrasound examination of the thyroid gland and adjacent soft tissues was performed. COMPARISON:  11/03/2012 FINDINGS: Parenchymal Echotexture: Moderately heterogenous Estimated total number of nodules >/= 1 cm: 0 Number of spongiform nodules >/=  2 cm not described below (TR1): 0 Number of mixed cystic and solid nodules >/= 1.5 cm not described below (TR2): 0 _________________________________________________________ Isthmus: 0.3 cm, previously 0.2 cm No discrete nodules are identified within the thyroid isthmus.  _________________________________________________________ Right lobe: 5.0 x 2.3 x 2.5 cm, previously 5.2 x 2.4 x 2.5 cm No discrete nodules are identified within the right lobe of the thyroid. _________________________________________________________ Left lobe: 6.9 x 2.8 x 2.5 cm, previously 5.8 x 2.6 x 2.6 cm No discrete nodules are identified within the left lobe of the thyroid. IMPRESSION: The gland remains enlarged and heterogeneous without focal nodule. No biopsy or follow-up is recommended at this time. The above is in keeping with the ACR TI-RADS recommendations - J Am Coll Radiol 2017;14:587-595. Electronically Signed   By: Marybelle Killings M.D.   On: 11/15/2015 13:15    Assessment & Plan:   Kassey was seen today for hypertension, hypothyroidism and hyperlipidemia.  Diagnoses and all orders for this visit:  Essential hypertension- her blood pressure is not adequately elevated on the ARB so will add a thiazide diuretic. I will also asked her to stop taking anti-inflammatories. It appears she has sleep apnea so Avastin to have that identified and treated. Her labs today are negative for any secondary causes or end organ damage. -     Comprehensive metabolic panel; Future -     CBC  with Differential/Platelet; Future -     telmisartan (MICARDIS) 80 MG tablet; Take 1 tablet (80 mg total) by mouth daily. -     chlorthalidone (HYGROTON) 25 MG tablet; Take 1 tablet (25 mg total) by mouth daily. -     Urinalysis, Routine w reflex microscopic; Future  Hyperglycemia- her A1c is at 5.8%, she is prediabetic and agrees to work on her lifestyle modifications. -     Hemoglobin A1c; Future  Other specified hypothyroidism- her TSH is slightly elevated so I've advised an increase in her levothyroxine dose. -     Lipid panel; Future -     TSH; Future -     levothyroxine (SYNTHROID, LEVOTHROID) 75 MCG tablet; Take 1 tablet (75 mcg total) by mouth daily.  HYPERCHOLESTEROLEMIA- her Framingham risk score is only  5% so at this time I do not recommend that she start taking a statin or aspirin for cardiovascular risk reduction. -     Lipid panel; Future -     TSH; Future  Snoring- I've asked her to see sleep medicine to have this evaluated and treated. -     Ambulatory referral to Sleep Studies   I have discontinued Ms. Macrae's etodolac, methocarbamol, methylPREDNISolone, promethazine-dextromethorphan, benzonatate, and levothyroxine. I am also having her start on chlorthalidone and levothyroxine. Additionally, I am having her maintain her albuterol, Eluxadoline, mometasone, Cholecalciferol, cetirizine, and telmisartan.  Meds ordered this encounter  Medications  . telmisartan (MICARDIS) 80 MG tablet    Sig: Take 1 tablet (80 mg total) by mouth daily.    Dispense:  90 tablet    Refill:  1  . chlorthalidone (HYGROTON) 25 MG tablet    Sig: Take 1 tablet (25 mg total) by mouth daily.    Dispense:  90 tablet    Refill:  1  . levothyroxine (SYNTHROID, LEVOTHROID) 75 MCG tablet    Sig: Take 1 tablet (75 mcg total) by mouth daily.    Dispense:  90 tablet    Refill:  1     Follow-up: Return in about 6 weeks (around 08/04/2016).  Scarlette Calico, MD

## 2016-06-24 ENCOUNTER — Encounter: Payer: Self-pay | Admitting: Internal Medicine

## 2016-06-24 MED ORDER — LEVOTHYROXINE SODIUM 75 MCG PO TABS: 75.0000 ug | ORAL_TABLET | Freq: Every day | ORAL | 1 refills | Status: DC

## 2016-07-01 ENCOUNTER — Encounter: Payer: Self-pay | Admitting: Internal Medicine

## 2016-07-02 ENCOUNTER — Ambulatory Visit: Payer: BLUE CROSS/BLUE SHIELD

## 2016-07-02 VITALS — BP 132/80 | HR 107

## 2016-07-02 DIAGNOSIS — I10 Essential (primary) hypertension: Secondary | ICD-10-CM

## 2016-07-30 ENCOUNTER — Ambulatory Visit (INDEPENDENT_AMBULATORY_CARE_PROVIDER_SITE_OTHER): Payer: BLUE CROSS/BLUE SHIELD | Admitting: Neurology

## 2016-07-30 ENCOUNTER — Encounter: Payer: Self-pay | Admitting: Neurology

## 2016-07-30 VITALS — BP 132/91 | HR 97 | Ht 65.0 in | Wt 189.0 lb

## 2016-07-30 DIAGNOSIS — R519 Headache, unspecified: Secondary | ICD-10-CM

## 2016-07-30 DIAGNOSIS — G4730 Sleep apnea, unspecified: Secondary | ICD-10-CM

## 2016-07-30 DIAGNOSIS — I10 Essential (primary) hypertension: Secondary | ICD-10-CM

## 2016-07-30 DIAGNOSIS — R0683 Snoring: Secondary | ICD-10-CM | POA: Diagnosis not present

## 2016-07-30 DIAGNOSIS — R51 Headache: Secondary | ICD-10-CM

## 2016-07-30 NOTE — Progress Notes (Signed)
SLEEP MEDICINE CLINIC   Provider:  Larey Seat, M D  Primary Care Physician:  Janith Lima, MD   Referring Provider: Janith Lima, MD    Chief Complaint  Patient presents with  . New Patient (Initial Visit)    diff with sleep, husband states she snores    HPI:  Cynthia Craig is a 56 y.o. female , seen here as in a referral  from Dr. Ronnald Ramp for a sleep apnea evaluation,  Chief complaint according to patient : "my husband has witnessed me to toss and turn, I don't get restful sleep, and I snore"  Cynthia Craig is a 56 year old married Caucasian lady who presents today to be evaluated if her intermittent headaches and sometimes poorly controlled blood pressure may be related to an underlying sleep apnea condition. She is in the process of losing weight with diet and exercise, she occasionally takes Motrin for joint pain, and she does not have complaints such as ankle edema, shortness of breath, chest pain, blurred vision or loss of vision or diplopia. Her husband however has sometimes move to a different room because of her noring at night. She has used albuterol inhalers as needed for wheezing and shortness of breath, she has allergic rhinitis and takes Zyrtec during allergy season, she has been on vitamin D supplements on Nasonex and on Tessalon capsules. She is also treated for hypothyroidism and has been on Micardis, Robaxin, prednisolone, Lodi and and Viberzi ( IBS).   Sleep habits are as follows: The patient usually goes to bed between 10:30 and 11:30 PM, most evenings she will have watched the news on TV before going to the bedroom. She does not have trouble initiating sleep, usually between 2:30 AM and 4 AM due to the need to urinate. She usually has no more bathroom breaks after that, she can go back to sleep, but the second half of the night is more restless and not as restorative. She describes herself as a site and belly sleeper, she uses 2 pillows for head support and 1  for body support. She feels that she dreams vividly and a lot, and sometimes will wake up from a dream. She is not known to act out dreams or have sleep walked. She is usually spontaneously awake before her alarm clock readings at 6 AM. She wakes frequently with headaches, these are occipital headaches and sometimes appears cervicogenic, radiating form the nape of the neck.   Sleep medical history and family sleep history: father is still alive at 35, doing well, lives independently. 2 brothers and one sister- older than her. A sister has thyroid disease, both parents had hypertension, paternal grandfather had lung cancer and her mother suffered from kidney disease. She died of end stage  kidney disease at age 68.  She had no childhood sleep disorder such as sleepwalking, night terrors or enuresis. She never underwent tonsillectomy and she has no history of neck surgery, ENT surgery or trauma to the airway facial skull.  Social history:  Married, 1 son, college grad.  ( 64 years old), the patient has no history of tobacco use, she will drink alcohol in social situations ,2 glasses on a week end. Caffeine;she purchases a [employer] coffee every morning on her way to work, she still has a couple of sodas a week, but very rarely drinks iced tea.   Review of Systems: Out of a complete 14 system review, the patient complains of only the following symptoms, and all other reviewed  systems are negative.  Mrs. Tissue endorsed trouble sleeping through the night, snoring, morning headaches, allergic rhinitis, neck stiffness, hypertension, she is in the process of losing weight by diet and exercise.  Epworth score 5 , Fatigue severity score 28  , depression score 1/ 15    Social History   Social History  . Marital status: Married    Spouse name: Cynthia Craig  . Number of children: Cynthia Craig  . Years of education: Cynthia Craig   Occupational History  . Student [employer]   Social History Main Topics  . Smoking  status: Never Smoker  . Smokeless tobacco: Never Used  . Alcohol use No  . Drug use: No  . Sexual activity: Yes   Other Topics Concern  . Not on file   Social History Narrative   Regular Exercise -  YES          Family History  Problem Relation Age of Onset  . Diabetes Mother   . Heart disease Mother        congestive heart failure  . Asthma Mother   . Hypertension Mother   . Kidney disease Mother   . Cancer Maternal Grandmother 80       stomach  . Cancer Maternal Grandfather 80       lung  . Thyroid disease Other   . Stomach cancer Paternal Grandmother   . Colon cancer Neg Hx   . Pancreatic cancer Neg Hx     Past Medical History:  Diagnosis Date  . Allergy    codeine  . Arthritis    rt knee  . GERD (gastroesophageal reflux disease)   . Hypertension   . Hypothyroidism     Past Surgical History:  Procedure Laterality Date  . ABDOMINAL HYSTERECTOMY  2001  . BLADDER REPAIR  2009   mesh / prolapsed bladder  . CESAREAN SECTION  1995  . CHOLECYSTECTOMY  July 2013    Current Outpatient Prescriptions  Medication Sig Dispense Refill  . albuterol (PROVENTIL HFA;VENTOLIN HFA) 108 (90 BASE) MCG/ACT inhaler Inhale 2 puffs into the lungs every 6 (six) hours as needed for wheezing or shortness of breath. 1 Inhaler 1  . cetirizine (ZYRTEC) 10 MG tablet Take 1 tablet (10 mg total) by mouth daily. 90 tablet 3  . chlorthalidone (HYGROTON) 25 MG tablet Take 1 tablet (25 mg total) by mouth daily. 90 tablet 1  . Cholecalciferol 50000 units TABS Take 1 tablet by mouth once a week. 12 tablet 3  . levothyroxine (SYNTHROID, LEVOTHROID) 75 MCG tablet Take 1 tablet (75 mcg total) by mouth daily. 90 tablet 1  . mometasone (NASONEX) 50 MCG/ACT nasal spray Place 4 sprays into the nose daily. (Patient taking differently: Place 4 sprays into the nose daily as needed. ) 17 g 12  . telmisartan (MICARDIS) 80 MG tablet Take 1 tablet (80 mg total) by mouth daily. 90 tablet 1   No current  facility-administered medications for this visit.     Allergies as of 07/30/2016 - Review Complete 07/30/2016  Allergen Reaction Noted  . Contrave [naltrexone-bupropion hcl er] Other (See Comments) 08/06/2015  . Codeine Nausea And Vomiting     Vitals: BP (!) 132/91   Pulse 97   Ht 5\' 5"  (1.651 m)   Wt 189 lb (85.7 kg)   BMI 31.45 kg/m  Last Weight:  Wt Readings from Last 1 Encounters:  07/30/16 189 lb (85.7 kg)   BDZ:HGDJ mass index is 31.45 kg/m.     Last Height:  Ht Readings from Last 1 Encounters:  07/30/16 5\' 5"  (1.651 m)    Physical exam:  General: The patient is awake, alert and appears not in acute distress. The patient is well groomed. Head: Normocephalic, atraumatic. Neck is supple. Mallampati 5, small lower h jaw, not retrognathia.  She has not been able to open her mouth wide. ,  neck circumference:17. Nasal airflow congested, Cardiovascular:  Regular rate and rhythm,  without  murmurs or carotid bruit, and without distended neck veins. Respiratory: Lungs are clear to auscultation. Skin:  Without evidence of edema, or rash Trunk: BMI is 31.  The patient's posture is erect   Neurologic exam : The patient is awake and alert, oriented to place and time.   Memory subjective described as intact.   Attention span & concentration ability appears normal.  Speech is fluent,  without dysarthria, dysphonia or aphasia.  Mood and affect are appropriate.  Cranial nerves: Pupils are equal and briskly reactive to light. Extraocular movements  in vertical and horizontal planes intact and without nystagmus. Visual fields by finger perimetry are intact. Hearing to finger rub intact. Facial sensation intact to fine touch. Facial motor strength is symmetric and tongue and uvula move midline. Shoulder shrug was symmetrical.  Motor exam:   Normal tone, muscle bulk and symmetric strength in all extremities. Sensory:  Fine touch, pinprick and vibration were tested in all extremities.  Proprioception tested in the upper extremities was normal. Coordination: Rapid alternating movements in the fingers/hands was normal. Finger-to-nose maneuver  normal without evidence of ataxia, dysmetria or tremor. Gait and station: Patient walks without assistive device . Strength within normal limits.  Stance is stable and normal.   Deep tendon reflexes: in the  upper and lower extremities are symmetric and intact.    Assessment:  After physical and neurologic examination, review of laboratory studies,  Personal review of imaging studies, reports of other /same  Imaging studies, results of polysomnography and / or neurophysiology testing and pre-existing records as far as provided in visit., my assessment is   1) I suspect that Mrs. Gammell has a mild degree of sleep apnea based on her husband's observation that she doesn't snore every night. She does report that she wakes up out of the dream sleep which can be a sign of a REM sleep accentuated obstructive sleep apnea. She also does have a larger neck circumference, a higher body mass index and a very narrow upper airway. Her dentist has often commented that she couldn't open her mouth very wide. She has a crowded dental status was a smallish jaw.  2) sleep related headaches, order capnography. She has morning headaches, not cluster HA.   It is possible that an underlying obstructive sleep apnea condition contributes to the difficulties with hypertension control. I will order a sleep study for this patient, to evaluate if apnea is present and also to initiate treatment if apnea is found. Should the apnea be very mild and several treatment options exist I will discuss with the patient by phone first before we pursue other avenues. I have explained that a dental device can sometimes be helpful, but usually works best in patients that do not have oxygen desaturations with apnea and have pre-existing condition of retrognathia.   The patient was advised  of the nature of the diagnosed disorder , the treatment options and the  risks for general health and wellness arising from not treating the condition.   I spent more than 40 minutes of face  to face time with the patient.  Greater than 50% of time was spent in counseling and coordination of care. We have discussed the diagnosis and differential and I answered the patient's questions.    Plan:  Treatment plan and additional workup : SPLIT night sleep study with Co2 measures for sleep related headaches.     Larey Seat, MD 7/99/8721, 5:87 PM  Certified in Neurology by ABPN Certified in [place] by [facility] [address] [place], [place] [address]

## 2016-07-30 NOTE — Patient Instructions (Signed)

## 2016-08-21 ENCOUNTER — Encounter: Payer: Self-pay | Admitting: Internal Medicine

## 2016-08-22 ENCOUNTER — Other Ambulatory Visit: Payer: Self-pay | Admitting: Internal Medicine

## 2016-08-22 MED ORDER — SCOPOLAMINE 1 MG/3DAYS TD PT72: 1.0000 | MEDICATED_PATCH | TRANSDERMAL | 0 refills | Status: DC

## 2016-09-11 ENCOUNTER — Ambulatory Visit (INDEPENDENT_AMBULATORY_CARE_PROVIDER_SITE_OTHER): Payer: BLUE CROSS/BLUE SHIELD | Admitting: Neurology

## 2016-09-11 DIAGNOSIS — R0683 Snoring: Secondary | ICD-10-CM

## 2016-09-11 DIAGNOSIS — G4730 Sleep apnea, unspecified: Secondary | ICD-10-CM

## 2016-09-11 DIAGNOSIS — I10 Essential (primary) hypertension: Secondary | ICD-10-CM

## 2016-09-11 DIAGNOSIS — R51 Headache: Secondary | ICD-10-CM

## 2016-09-11 DIAGNOSIS — R519 Headache, unspecified: Secondary | ICD-10-CM

## 2016-09-11 DIAGNOSIS — G4733 Obstructive sleep apnea (adult) (pediatric): Secondary | ICD-10-CM | POA: Diagnosis not present

## 2016-09-17 ENCOUNTER — Telehealth: Payer: Self-pay | Admitting: Neurology

## 2016-09-17 NOTE — Telephone Encounter (Signed)
-----   Message from Larey Seat, MD sent at 09/17/2016  5:02 PM EDT ----- Cynthia Craig, Please call patient and explain to her that she has mild OSA, that REM sleep exacerbated OSA and that CPAP is therefor the preferred treatment option. I have placed an order for a CPAP titration .

## 2016-09-17 NOTE — Addendum Note (Signed)
Addended by: Larey Seat on: 09/17/2016 05:02 PM   Modules accepted: Orders

## 2016-09-17 NOTE — Telephone Encounter (Signed)
I called pt. I advised pt that Dr. Brett Fairy reviewed their sleep study results and found that pt has OSA. Dr. Brett Fairy recommends that pt return for a CPAP titration. Pt verbalized understanding of results. Pt had no questions at this time but was encouraged to call back if questions arise.

## 2016-09-17 NOTE — Procedures (Signed)
PATIENT'S NAME:  Cynthia Craig, Frier DOB:      October 31, 1960      MR#:    [id number]     DATE OF RECORDING: 09/11/2016 REFERRING M.D.:  Scarlette Calico, MD Study Performed:   Baseline Polysomnogram HISTORY:  Mrs. Babe endorsed trouble sleeping through the night, snoring, morning headaches, allergic rhinitis, neck stiffness, and hypertension. She is in the process of losing weight by diet and exercise. The patient endorsed the Epworth Sleepiness Scale at 5 points.   The patient's weight 190 pounds with a height of 65 (inches), resulting in a BMI of 31.6 kg/m2. The patient's neck circumference measured 17 inches.  CURRENT MEDICATIONS: Proventil, Zyrtec, Hygroton, Synthroid, Nasonex, Micardis   PROCEDURE:  This is a multichannel digital polysomnogram utilizing the SomnoStar 11.2 system.  Electrodes and sensors were applied and monitored per AASM Specifications.   EEG, EOG, Chin and Limb EMG, were sampled at 200 Hz.  ECG, Snore and Nasal Pressure, Thermal Airflow, Respiratory Effort, CPAP Flow and Pressure, Oximetry was sampled at 50 Hz. Digital video and audio were recorded.      BASELINE STUDY ;  Lights Out was at 22:45 and Lights On at 05:01.  Total recording time (TRT) was 376.5 minutes, with a total sleep time (TST) of 312 minutes.   The patient's sleep latency was 9.5 minutes.  REM latency was 93.5 minutes.  The sleep efficiency was 82.9 %.     SLEEP ARCHITECTURE: WASO (Wake after sleep onset) was 55 minutes.  There were 17.5 minutes in Stage N1, 104 minutes Stage N2, 144.5 minutes Stage N3 and 46 minutes in Stage REM.  The percentage of Stage N1 was 5.6%, Stage N2 was 33.3%, Stage N3 was 46.3% and Stage R (REM sleep) was 14.7%.   RESPIRATORY ANALYSIS:  There were a total of 61 respiratory events:  19 obstructive apneas, 4 central apneas and 1 mixed apnea with a total of 24 apneas and an apnea index (AI) of 4.6 /hour.  There were 37 hypopneas with a hypopnea index of 7.1 /hour. The patient also had 0  respiratory event related arousals (RERAs).    The total APNEA/HYPOPNEA INDEX (AHI) was 11.7/hour and the total RESPIRATORY DISTURBANCE INDEX was 11.7 /hour.  21 events occurred in REM sleep and 46 events in NREM. The REM AHI was 27.4 /hour, versus a non-REM AHI of 9.. The patient spent 0 minutes of total sleep time in the supine position and 312 minutes in non-supine. The supine AHI was 0.0 versus a non-supine AHI of 11.7.  OXYGEN SATURATION & C02:  The Wake baseline 02 saturation was 96%, with the lowest being 73%. Time spent below 89% saturation equaled 25 minutes.   PERIODIC LIMB MOVEMENTS:   The patient had a total of 0 Periodic Limb Movements.  The arousals were noted as: 17 were spontaneous, 0 were associated with PLMs, and 5 were associated with respiratory events.  Audio and video analysis did not show any abnormal or unusual movements, behaviors, phonations or vocalizations.     The patient took one bathroom break. Loud snoring was noted. The patient did not sleep supine.  EKG was in keeping with normal sinus rhythm (NSR).  IMPRESSION:  1. Mostly Obstructive Sleep Apnea(OSA) with AHI of 11.7/hr, with mild hypoxemia, and loud snoring. Since the patient slept non supine only, there is a chance that her AHI would have been worse in supine position.  2. Snoring and hypoxemia were clustered around REM sleep , REM AHI was 27.4/hr.  RECOMMENDATIONS:  1. Recommend full-night, attended, CPAP titration study to optimize therapy. REM dependent sleep apnea responds less to dental device. A dental device can be entertained should CPAP be poorly tolerated .  2. Advise to continue to lose weight, by diet and exercise (BMI 31.6). 3. Further information regarding OSA may be obtained from [employer] (www.sleepfoundation.org) or American Sleep Apnea Association (www.sleepapnea.org). A follow up appointment will be scheduled in the Sleep Clinic at [facility]. The  referring provider will be notified of the results.      I certify that I have reviewed the entire raw data recording prior to the issuance of this report in accordance with the Standards of Accreditation of the [place] Academy of Sleep Medicine (AASM)      Larey Seat, MD   09-17-2016  Diplomat, American Board of Psychiatry and Neurology  Diplomat, American Board of [place] Director, [place] [facility] [employer]

## 2016-10-02 ENCOUNTER — Encounter (HOSPITAL_BASED_OUTPATIENT_CLINIC_OR_DEPARTMENT_OTHER): Payer: Self-pay

## 2016-10-02 ENCOUNTER — Emergency Department (HOSPITAL_BASED_OUTPATIENT_CLINIC_OR_DEPARTMENT_OTHER)
Admission: EM | Admit: 2016-10-02 | Discharge: 2016-10-02 | Disposition: A | Discharge status: Home / Self Care | Payer: BLUE CROSS/BLUE SHIELD | Attending: Emergency Medicine | Admitting: Emergency Medicine

## 2016-10-02 DIAGNOSIS — I10 Essential (primary) hypertension: Secondary | ICD-10-CM | POA: Diagnosis not present

## 2016-10-02 DIAGNOSIS — L02211 Cutaneous abscess of abdominal wall: Secondary | ICD-10-CM | POA: Diagnosis not present

## 2016-10-02 DIAGNOSIS — Z79899 Other long term (current) drug therapy: Secondary | ICD-10-CM | POA: Insufficient documentation

## 2016-10-02 DIAGNOSIS — E039 Hypothyroidism, unspecified: Secondary | ICD-10-CM | POA: Diagnosis not present

## 2016-10-02 DIAGNOSIS — R1903 Right lower quadrant abdominal swelling, mass and lump: Secondary | ICD-10-CM | POA: Diagnosis not present

## 2016-10-02 MED ORDER — ACETAMINOPHEN 500 MG PO TABS
1000.0000 mg | ORAL_TABLET | Freq: Once | ORAL | Status: AC
  Administered 2016-10-02: 1000 mg via ORAL
  Filled 2016-10-02: qty 2

## 2016-10-02 MED ORDER — IBUPROFEN 400 MG PO TABS
400.0000 mg | ORAL_TABLET | Freq: Once | ORAL | Status: AC
  Administered 2016-10-02: 400 mg via ORAL
  Filled 2016-10-02: qty 1

## 2016-10-02 MED ORDER — LIDOCAINE-EPINEPHRINE (PF) 2 %-1:200000 IJ SOLN
10.0000 mL | Freq: Once | INTRAMUSCULAR | Status: AC
  Administered 2016-10-02: 10 mL
  Filled 2016-10-02: qty 20

## 2016-10-02 NOTE — ED Triage Notes (Signed)
Pt states she is being treated for infected hair follicle to right groin area- with doxy rx-area is now worse-NAD-steady gait

## 2016-10-02 NOTE — ED Provider Notes (Signed)
[facility]PT [facility] Provider Note   CSN: [id number] Arrival date & time: 10/02/16  1142     History   Chief Complaint Chief Complaint  Patient presents with  . Abscess    HPI Cynthia Craig is a 56 y.o. female.  Patient c/o red, swollen, painful area to right lower abdomen for the past 3-4 days.  It began as small red bump, but getting larger. Was placed on antibiotic at work clinic but didn't get better. No history of recurrent abscesses. No generalized abd pain. No nv. No fever.    The history is provided by the patient.  Abscess  Associated symptoms: no fever, no nausea and no vomiting     Past Medical History:  Diagnosis Date  . Allergy    codeine  . Arthritis    rt knee  . GERD (gastroesophageal reflux disease)   . Hypertension   . Hypothyroidism     Patient Active Problem List   Diagnosis Date Noted  . Sleep-related headache 07/30/2016  . HTN (hypertension), benign 07/30/2016  . Sleep apnea 07/30/2016  . Vitamin D deficiency 06/12/2015  . Hyperglycemia 06/11/2015  . Routine general medical examination at a health care facility 06/11/2015  . Snoring 06/11/2015  . Obesity (BMI 30.0-34.9) 06/11/2015  . Allergic rhinitis due to pollen 04/03/2015  . IBS (irritable bowel syndrome) 01/01/2015  . OA (osteoarthritis) of knee 04/30/2010  . Hypothyroidism 01/08/2010  . HYPERCHOLESTEROLEMIA 08/01/2009  . Essential hypertension 08/01/2009    Past Surgical History:  Procedure Laterality Date  . ABDOMINAL HYSTERECTOMY  2001  . BLADDER REPAIR  2009   mesh / prolapsed bladder  . CESAREAN SECTION  1995  . CHOLECYSTECTOMY  July 2013    OB History    No data available       Home Medications    Prior to Admission medications   Medication Sig Start Date End Date Taking? Authorizing Provider  albuterol (PROVENTIL HFA;VENTOLIN HFA) 108 (90 BASE) MCG/ACT inhaler Inhale 2 puffs into the lungs every 6 (six) hours as needed for wheezing or shortness of breath.  09/28/14   Biagio Borg, MD  cetirizine (ZYRTEC) 10 MG tablet Take 1 tablet (10 mg total) by mouth daily. 02/11/16   Janith Lima, MD  chlorthalidone (HYGROTON) 25 MG tablet Take 1 tablet (25 mg total) by mouth daily. 06/23/16   Janith Lima, MD  Cholecalciferol 50000 units TABS Take 1 tablet by mouth once a week. 06/12/15   Janith Lima, MD  levothyroxine (SYNTHROID, LEVOTHROID) 75 MCG tablet Take 1 tablet (75 mcg total) by mouth daily. 06/24/16   Janith Lima, MD  mometasone (NASONEX) 50 MCG/ACT nasal spray Place 4 sprays into the nose daily. Patient taking differently: Place 4 sprays into the nose daily as needed.  04/03/15   Janith Lima, MD  scopolamine (TRANSDERM-SCOP, 1.5 MG,) 1 MG/3DAYS Place 1 patch (1.5 mg total) onto the skin every 3 (three) days. 08/22/16   Janith Lima, MD  telmisartan (MICARDIS) 80 MG tablet Take 1 tablet (80 mg total) by mouth daily. 06/23/16   Janith Lima, MD    Family History Family History  Problem Relation Age of Onset  . Diabetes Mother   . Heart disease Mother        congestive heart failure  . Asthma Mother   . Hypertension Mother   . Kidney disease Mother   . Cancer Maternal Grandmother 80       stomach  .  Cancer Maternal Grandfather 80       lung  . Thyroid disease Other   . Stomach cancer Paternal Grandmother   . Colon cancer Neg Hx   . Pancreatic cancer Neg Hx     Social History Social History  Substance Use Topics  . Smoking status: Never Smoker  . Smokeless tobacco: Never Used  . Alcohol use No     Allergies   Contrave [naltrexone-bupropion hcl er] and Codeine   Review of Systems Review of Systems  Constitutional: Negative for fever.  Gastrointestinal: Negative for nausea and vomiting.  Skin: Negative for rash.     Physical Exam Updated Vital Signs BP (!) 129/93 (BP Location: Left Arm)   Pulse (!) 108   Temp 98.4 F (36.9 C) (Oral)   Resp 18   SpO2 98%   Physical Exam  Constitutional: She appears  well-developed and well-nourished. No distress.  Eyes: Conjunctivae are normal. No scleral icterus.  Neck: No tracheal deviation present.  Pulmonary/Chest: Effort normal. No respiratory distress.  Abdominal: Soft. Normal appearance. She exhibits no distension.  Musculoskeletal: She exhibits no edema.  Neurological: She is alert.  Skin: Skin is warm and dry. No rash noted. She is not diaphoretic.  Right lower abd skin abscess. Mild surround erythema.   Psychiatric: She has a normal mood and affect.  Nursing note and vitals reviewed.    ED Treatments / Results  Labs (all labs ordered are listed, but only abnormal results are displayed) Labs Reviewed - No data to display  EKG  EKG Interpretation None       Radiology No results found.  Procedures .Marland KitchenIncision and Drainage Date/Time: 10/02/2016 12:41 PM Performed by: Lajean Saver Authorized by: Lajean Saver   Location:    Type:  Abscess   Size:  2-3 cm   Location:  Trunk   Trunk location:  Abdomen Pre-procedure details:    Skin preparation:  Betadine Anesthesia (see MAR for exact dosages):    Anesthesia method:  Local infiltration   Local anesthetic:  Lidocaine 2% WITH epi Procedure type:    Complexity:  Complex Procedure details:    Incision types:  Elliptical   Incision depth:  Subcutaneous   Wound management:  Probed and deloculated and irrigated with saline   Drainage:  Purulent   Drainage amount:  Moderate   Wound treatment:  Wound left open and drain placed   Packing materials:  1/4 in iodoform gauze Post-procedure details:    Patient tolerance of procedure:  Tolerated well, no immediate complications   (including critical care time)  Medications Ordered in ED Medications  lidocaine-EPINEPHrine (XYLOCAINE W/EPI) 2 %-1:200000 (PF) injection 10 mL (not administered)     Initial Impression / Assessment and Plan / ED Course  I have reviewed the triage vital signs and the nursing notes.  Pertinent labs  & imaging results that were available during my care of the patient were reviewed by me and considered in my medical decision making (see chart for details).  I and D of abscess.  Given surrounding erythema, pt will complete the course of her antibiotic.   Reviewed nursing notes and prior charts for additional history.   Motrin and acetaminophen po.     Final Clinical Impressions(s) / ED Diagnoses   Final diagnoses:  None    New Prescriptions New Prescriptions   No medications on file     Lajean Saver, MD 10/02/16 1242

## 2016-10-02 NOTE — Discharge Instructions (Signed)
It was our pleasure to provide your ER care today - we hope that you feel better.  Keep area very clean.  Have packing removed in 2 days time.  Complete the course of your antibiotic.  Return to ER if worse, spreading redness, severe pain, high fevers, other concern.

## 2016-10-06 ENCOUNTER — Ambulatory Visit (INDEPENDENT_AMBULATORY_CARE_PROVIDER_SITE_OTHER): Payer: BLUE CROSS/BLUE SHIELD | Admitting: Neurology

## 2016-10-06 DIAGNOSIS — G4733 Obstructive sleep apnea (adult) (pediatric): Secondary | ICD-10-CM | POA: Diagnosis not present

## 2016-10-06 DIAGNOSIS — R519 Headache, unspecified: Secondary | ICD-10-CM

## 2016-10-06 DIAGNOSIS — R51 Headache: Secondary | ICD-10-CM

## 2016-10-06 DIAGNOSIS — R0683 Snoring: Secondary | ICD-10-CM

## 2016-10-06 DIAGNOSIS — I10 Essential (primary) hypertension: Secondary | ICD-10-CM

## 2016-10-09 NOTE — Procedures (Signed)
PATIENT'S NAME:  Cynthia Craig, Cynthia Craig DOB:      [birth year]      MR#:    [id number]     DATE OF RECORDING: 10/06/2016 REFERRING M.D.:  Scarlette Calico, MD Study Performed:   CPAP  Titration HISTORY:   This patient returns after her PSG from 09/11/16 documented mostly Obstructive Sleep Apnea (OSA) with AHI of 11.7/hr, with mild hypoxemia, and loud snoring. Since the patient slept non supine only, there is a chance that her AHI would have been worse in supine position. Snoring and hypoxemia were clustered around REM sleep, REM AHI was 27.4/hr.   Mrs. Servello endorsed trouble sleeping through the night, snoring, morning headaches, allergic rhinitis, neck stiffness, hypertension, she is in the process of losing weight by diet and exercise. The patient endorsed the Epworth Sleepiness Scale at 5/24 points.   The patient's weight 190 pounds with a height of 65 (inches), resulting in a BMI of 31.6 kg/m2.  The patient's neck circumference measured 17 inches.  CURRENT MEDICATIONS: Proventil, Zyrtec, Hygroton, Synthroid, Nasonex, Micardis.    PROCEDURE:  This is a multichannel digital polysomnogram utilizing the SomnoStar 11.2 system.  Electrodes and sensors were applied and monitored per AASM Specifications.   EEG, EOG, Chin and Limb EMG, were sampled at 200 Hz.  ECG, Snore and Nasal Pressure, Thermal Airflow, Respiratory Effort, CPAP Flow and Pressure, Oximetry was sampled at 50 Hz. Digital video and audio were recorded.      CPAP was initiated at 5 cmH20 with heated humidity per AASM split night standards and pressure was advanced to 9 cmH20 because of hypopneas, apneas and desaturations.  At a PAP pressure of 9 cmH20, there was a reduction of the AHI to 0.0 with improvement of obstructive sleep apnea.    Lights Out was at 22:33 and Lights On at 05:14. Total recording time (TRT) was 399.5 minutes, with a total sleep time (TST) of 287.5 minutes. The patient's sleep latency was 98 minutes with 0 minutes of wake time  after sleep onset. REM latency was 68.5 minutes.  The sleep efficiency was 72.1 %.    SLEEP ARCHITECTURE: WASO (Wake after sleep onset) was 14 minutes.  There were 5.5 minutes in Stage N1, 138.5 minutes Stage N2, 57 minutes Stage N3 and 86.5 minutes in Stage REM.  The percentage of Stage N1 was 1.9%, Stage N2 was 48.2%, Stage N3 was 19.8% and Stage R (REM sleep) was 30.1%. The sleep architecture was notable for REM sleep progression until the study ended.   RESPIRATORY ANALYSIS:  There was a total of 0 respiratory events: 0 obstructive apneas, 0 central apneas and 0 mixed apneas with a total of 0 apneas and an apnea index (AI) of 0 /hour. There were 0 hypopneas with a hypopnea index of 0/hour. The patient also had 0 respiratory event related arousals (RERAs).     The total APNEA/HYPOPNEA INDEX  (AHI) was 0 /hour and the total RESPIRATORY DISTURBANCE INDEX was 0.0/hr. The REM AHI was 0.0 /hour versus a non-REM AHI of 0.0 /hour.  The patient spent 1 minute of total sleep time in the supine position and 287 minutes in non-supine.  OXYGEN SATURATION & C02:  The baseline 02 saturation was 97%, with the lowest being 92%. Time spent below 89% saturation equaled 0 minutes.  PERIODIC LIMB MOVEMENTS:   The patient had a total of 0 Periodic Limb Movements. The arousals were noted as: 10 were spontaneous, 0 were associated with PLMs, and 0 were associated with respiratory  events.  Audio and video analysis did not show any abnormal or unusual movements, behaviors, phonations or vocalizations.  No Nocturia.  Mild Snoring was noted. EKG was in keeping with normal sinus rhythm (NSR).  Post-study, the patient indicated that sleep was better than usual.  The patient was fitted with a ResMed AirFit P10 (Small) apparatus.  DIAGNOSIS 1. Obstructive Sleep Apnea responding well to CPAP at 9 cm water with 3 cm EPR and nasal pillow AirFit p 10 in XS  PLANS/RECOMMENDATIONS: a. CPAP clinic follow up will be scheduled in 2-3  months after receiving device.  b. Thereafter, yearly CPAP clinic follow-up is advisable.  A follow up appointment will be scheduled in the Sleep Clinic at [facility].   Please call [phone] with any questions.      I certify that I have reviewed the entire raw data recording prior to the issuance of this report in accordance with the Standards of Accreditation of the American Academy of Sleep Medicine (AASM)    Larey Seat, M.D.    10-09-2016  Diplomat, American Board of Psychiatry and Neurology  Diplomat, [place] of Sleep Medicine Medical Director, [place] [facility]

## 2016-10-09 NOTE — Addendum Note (Signed)
Addended by: Larey Seat on: 10/09/2016 12:03 PM   Modules accepted: Orders

## 2016-10-12 ENCOUNTER — Telehealth: Payer: Self-pay | Admitting: Neurology

## 2016-10-12 NOTE — Telephone Encounter (Signed)
I called pt. I advised pt that Dr. Brett Fairy reviewed their sleep study results and found that pt does have OSA. Dr. Brett Fairy  recommends that pt uses CPAP. I reviewed PAP compliance expectations with the pt. Pt is agreeable to starting a CPAP. I advised pt that an order will be sent to a DME, [facility], and [facility] will call the pt within about one week after they file with the pt's insurance. [facility] will show the pt how to use the machine, fit for masks, and troubleshoot the CPAP if needed. A follow up appt was made for insurance purposes with Cecille Rubin, NP on Jan 04 2017 at 12:45 pm . Pt verbalized understanding to arrive 15 minutes early and bring their CPAP. A letter with all of this information in it will be mailed to the pt as a reminder. I verified with the pt that the address we have on file is correct. Pt verbalized understanding of results. Pt had no questions at this time but was encouraged to call back if questions arise.

## 2016-10-12 NOTE — Telephone Encounter (Signed)
-----   Message from Larey Seat, MD sent at 10/09/2016 12:03 PM EDT ----- . Cynthia Craig had at first trouble to go to sleep but did well once asleep- this was her first night with CPAP and the prolonged sleep latency will pass. Obstructive Sleep Apnea responding well to CPAP at 9 cm water  with 3 cm EPR and nasal pillow AirFit p 10 in XS - ordered in EPIC, CD

## 2016-11-04 DIAGNOSIS — G4733 Obstructive sleep apnea (adult) (pediatric): Secondary | ICD-10-CM | POA: Diagnosis not present

## 2016-11-12 DIAGNOSIS — Z6832 Body mass index (BMI) 32.0-32.9, adult: Secondary | ICD-10-CM | POA: Diagnosis not present

## 2016-11-12 DIAGNOSIS — Z01419 Encounter for gynecological examination (general) (routine) without abnormal findings: Secondary | ICD-10-CM | POA: Diagnosis not present

## 2016-11-12 DIAGNOSIS — Z1231 Encounter for screening mammogram for malignant neoplasm of breast: Secondary | ICD-10-CM | POA: Diagnosis not present

## 2016-11-12 LAB — HM MAMMOGRAPHY

## 2016-12-05 DIAGNOSIS — G4733 Obstructive sleep apnea (adult) (pediatric): Secondary | ICD-10-CM | POA: Diagnosis not present

## 2017-01-01 NOTE — Progress Notes (Signed)
[facility]  PATIENT: Cynthia Craig DOB: 08/13/1960   REASON FOR VISIT: Follow-up for obstructive sleep apnea with initial CPAP compliance HISTORY [place] ILLNESS:UPDATE 12/17/2018CM [name], 56 year old female returns for follow-up with sleep study showing obstructive sleep apnea.  She is here for her first CPAP compliance.  Data dated 12/05/2016-01/03/2017 shows greater than 4 hours for 20 days at 67% average usage 4 hours 48 minutes.  Set pressure 9 cm.  EPR level 3.  AHI 0.5.  ESS score 6.  He says she is having much less daytime drowsiness now with her CPAP he does find herself taking it off at night still says she is getting adjusted to it She returns for reevaluation  07/30/16 CDSusan S Mccleave is a 56 y.o. female , seen here as in a referral  from Dr. Ronnald Ramp for a sleep apnea evaluation,  Chief complaint according to patient : "my husband has witnessed me to toss and turn, I don't get restful sleep, and I snore"  Mrs. Paulsen is a 56 year old married Caucasian lady who presents today to be evaluated if her intermittent headaches and sometimes poorly controlled blood pressure may be related to an underlying sleep apnea condition. She is in the process of losing weight with diet and exercise, she occasionally takes Motrin for joint pain, and she does not have complaints such as ankle edema, shortness of breath, chest pain, blurred vision or loss of vision or diplopia. Her husband however has sometimes move to a different room because of her noring at night. She has used albuterol inhalers as needed for wheezing and shortness of breath, she has allergic rhinitis and takes Zyrtec during allergy season, she has been on vitamin D supplements on Nasonex and on Tessalon capsules. She is also treated for hypothyroidism and has been on Micardis, Robaxin, prednisolone, [place] and and Viberzi ( IBS).   Sleep habits are as follows: The patient  usually goes to bed between 10:30 and 11:30 PM, most evenings she will have watched the news on TV before going to the bedroom. She does not have trouble initiating sleep, usually between 2:30 AM and 4 AM due to the need to urinate. She usually has no more bathroom breaks after that, she can go back to sleep, but the second half of the night is more restless and not as restorative. She describes herself as a site and belly sleeper, she uses 2 pillows for head support and 1 for body support. She feels that she dreams vividly and a lot, and sometimes will wake up from a dream. She is not known to act out dreams or have sleep walked. She is usually spontaneously awake before her alarm clock readings at 6 AM. She wakes frequently with headaches, these are occipital headaches and sometimes appears cervicogenic, radiating form the nape of the neck.     REVIEW OF SYSTEMS: Full 14 system review of systems performed and notable only for those listed, all others are neg:  Constitutional: neg  Cardiovascular: neg Ear/Nose/Throat: neg  Skin: neg Eyes: neg Respiratory: neg Gastroitestinal: neg  Hematology/Lymphatic: neg  Endocrine: neg Musculoskeletal:neg Allergy/Immunology: neg Neurological: neg Psychiatric: neg Sleep : Instructive sleep apnea with CPAP   ALLERGIES: Allergies  Allergen Reactions  . Contrave [Naltrexone-Bupropion Hcl Er] Other (See Comments)    N/V  . Codeine Nausea And Vomiting    HOME MEDICATIONS: Outpatient Medications Prior to Visit  Medication Sig Dispense Refill  . albuterol (PROVENTIL HFA;VENTOLIN HFA)  108 (90 BASE) MCG/ACT inhaler Inhale 2 puffs into the lungs every 6 (six) hours as needed for wheezing or shortness of breath. 1 Inhaler 1  . cetirizine (ZYRTEC) 10 MG tablet Take 1 tablet (10 mg total) by mouth daily. 90 tablet 3  . chlorthalidone (HYGROTON) 25 MG tablet Take 1 tablet (25 mg total) by mouth daily. 90 tablet 1  . Cholecalciferol 50000 units TABS Take 1  tablet by mouth once a week. 12 tablet 3  . levothyroxine (SYNTHROID, LEVOTHROID) 75 MCG tablet Take 1 tablet (75 mcg total) by mouth daily. 90 tablet 1  . mometasone (NASONEX) 50 MCG/ACT nasal spray Place 4 sprays into the nose daily. (Patient taking differently: Place 4 sprays into the nose daily as needed. ) 17 g 12  . telmisartan (MICARDIS) 80 MG tablet Take 1 tablet (80 mg total) by mouth daily. 90 tablet 1  . scopolamine (TRANSDERM-SCOP, 1.5 MG,) 1 MG/3DAYS Place 1 patch (1.5 mg total) onto the skin every 3 (three) days. 10 patch 0   No facility-administered medications prior to visit.     PAST MEDICAL HISTORY: Past Medical History:  Diagnosis Date  . Allergy    codeine  . Arthritis    rt knee  . GERD (gastroesophageal reflux disease)   . Hypertension   . Hypothyroidism     PAST SURGICAL HISTORY: Past Surgical History:  Procedure Laterality Date  . ABDOMINAL HYSTERECTOMY  2001  . BLADDER REPAIR  2009   mesh / prolapsed bladder  . CESAREAN SECTION  1995  . CHOLECYSTECTOMY  July 2013    FAMILY HISTORY: Family History  Problem Relation Age of Onset  . Diabetes Mother   . Heart disease Mother        congestive heart failure  . Asthma Mother   . Hypertension Mother   . Kidney disease Mother   . Cancer Maternal Grandmother 80       stomach  . Cancer Maternal Grandfather 80       lung  . Thyroid disease Other   . Stomach cancer Paternal Grandmother   . Colon cancer Neg Hx   . Pancreatic cancer Neg Hx     SOCIAL HISTORY: Social History   Socioeconomic History  . Marital status: Married    Spouse name: Not on file  . Number of children: Not on file  . Years of education: Not on file  . Highest education level: Not on file  Social Needs  . Financial resource strain: Not on file  . Food insecurity - worry: Not on file  . Food insecurity - inability: Not on file  . Transportation needs - medical: Not on file  . Transportation needs - non-medical: Not on file    Occupational History  . Occupation: Lexicographer: [employer]  Tobacco Use  . Smoking status: Never Smoker  . Smokeless tobacco: Never Used  Substance and Sexual Activity  . Alcohol use: No  . Drug use: No  . Sexual activity: Not on file  Other Topics Concern  . Not on file  Social History Narrative   Regular Exercise -  YES           PHYSICAL EXAM  Vitals:   01/04/17 1241  BP: (!) 150/70  Pulse: 94  Weight: 203 lb 3.2 oz (92.2 kg)  Height: 5' 5"  (1.651 m)   Body mass index is 33.81 kg/m.  Generalized: Well developed, in no acute distress  Head: normocephalic and atraumatic,. Oropharynx  benign  Neck: Supple,  Musculoskeletal: No deformity   Neurological examination   Mentation: Alert oriented to time, place, history taking. Attention span and concentration appropriate. Recent and remote memory intact.  Follows all commands speech and language fluent.   Cranial nerve II-XII: Pupils were equal round reactive to light extraocular movements were full, visual field were full on confrontational test. Facial sensation and strength were normal. hearing was intact to finger rubbing bilaterally. Uvula tongue midline. head turning and shoulder shrug were normal and symmetric.Tongue protrusion into cheek strength was normal. Motor: normal bulk and tone, full strength in the BUE, BLE,  Sensory: normal and symmetric to light touch,  Coordination: finger-nose-finger, heel-to-shin bilaterally, no dysmetria Gait and Station: Rising up from seated position without assistance, normal stance,  moderate stride, good arm swing, smooth turning, able to perform tiptoe, and heel walking without difficulty. Tandem gait is steady  DIAGNOSTIC DATA (LABS, IMAGING, TESTING) - I reviewed patient records, labs, notes, testing and imaging myself where available.  Lab Results  Component Value Date   WBC 10.8 (H) 06/23/2016   HGB 14.0 06/23/2016   HCT 41.4 06/23/2016   MCV 87.4  06/23/2016   PLT 356.0 06/23/2016      Component Value Date/Time   NA 140 06/23/2016 1614   K 4.0 06/23/2016 1614   CL 102 06/23/2016 1614   CO2 29 06/23/2016 1614   GLUCOSE 101 (H) 06/23/2016 1614   BUN 14 06/23/2016 1614   CREATININE 0.96 06/23/2016 1614   CALCIUM 9.9 06/23/2016 1614   PROT 7.5 06/23/2016 1614   ALBUMIN 4.7 06/23/2016 1614   AST 13 06/23/2016 1614   ALT 16 06/23/2016 1614   ALKPHOS 84 06/23/2016 1614   BILITOT 0.3 06/23/2016 1614   GFRNONAA 78.06 01/08/2010 1327   GFRAA  03/16/2008 0915    >60        The eGFR has been calculated using the MDRD equation. This calculation has not been validated in all clinical situations. eGFR's persistently <60 mL/min signify possible Chronic Kidney Disease.   Lab Results  Component Value Date   CHOL 213 (H) 06/23/2016   HDL 46.30 06/23/2016   LDLCALC 153 (H) 06/11/2015   LDLDIRECT 143.0 06/23/2016   TRIG 223.0 (H) 06/23/2016   CHOLHDL 5 06/23/2016   Lab Results  Component Value Date   HGBA1C 5.8 06/23/2016    Lab Results  Component Value Date   TSH 6.09 (H) 06/23/2016      ASSESSMENT AND PLAN  56 y.o. year old female recently diagnosed with obstructive sleep apnea here for her first CPAP compliance. Data dated 12/05/2016-01/03/2017 shows greater than 4 hours for 20 days at 67% average usage 4 hours 48 minutes.  Set pressure 9 cm.  EPR level 3.  AHI 0.5.  ESS score 6.   CPAP compliance 67% data reviewed with patient Continue same settings Follow-up in 4 months for repeat compliance Dennie Bible, [facility], [facility], APRN  [facility] [address] [place], [place] [address] [phone]

## 2017-01-04 ENCOUNTER — Ambulatory Visit: Payer: BLUE CROSS/BLUE SHIELD | Admitting: Nurse Practitioner

## 2017-01-04 ENCOUNTER — Encounter: Payer: Self-pay | Admitting: Nurse Practitioner

## 2017-01-04 DIAGNOSIS — G4733 Obstructive sleep apnea (adult) (pediatric): Secondary | ICD-10-CM | POA: Diagnosis not present

## 2017-01-04 DIAGNOSIS — Z9989 Dependence on other enabling machines and devices: Secondary | ICD-10-CM | POA: Diagnosis not present

## 2017-01-04 NOTE — Progress Notes (Signed)
I agree with the assessment and plan as directed by NP .The patient is known to me .   Teara Duerksen, MD  

## 2017-01-04 NOTE — Patient Instructions (Signed)
CPAP compliance 67% Continue same settings Follow-up in 4 months for repeat compliance

## 2017-01-29 DIAGNOSIS — L0291 Cutaneous abscess, unspecified: Secondary | ICD-10-CM | POA: Diagnosis not present

## 2017-02-04 DIAGNOSIS — G4733 Obstructive sleep apnea (adult) (pediatric): Secondary | ICD-10-CM | POA: Diagnosis not present

## 2017-02-24 DIAGNOSIS — D2261 Melanocytic nevi of right upper limb, including shoulder: Secondary | ICD-10-CM | POA: Diagnosis not present

## 2017-02-24 DIAGNOSIS — L0889 Other specified local infections of the skin and subcutaneous tissue: Secondary | ICD-10-CM | POA: Diagnosis not present

## 2017-02-24 DIAGNOSIS — L570 Actinic keratosis: Secondary | ICD-10-CM | POA: Diagnosis not present

## 2017-02-24 DIAGNOSIS — D485 Neoplasm of uncertain behavior of skin: Secondary | ICD-10-CM | POA: Diagnosis not present

## 2017-02-24 DIAGNOSIS — D225 Melanocytic nevi of trunk: Secondary | ICD-10-CM | POA: Diagnosis not present

## 2017-02-24 DIAGNOSIS — L738 Other specified follicular disorders: Secondary | ICD-10-CM | POA: Diagnosis not present

## 2017-02-24 DIAGNOSIS — D2272 Melanocytic nevi of left lower limb, including hip: Secondary | ICD-10-CM | POA: Diagnosis not present

## 2017-02-24 DIAGNOSIS — D2262 Melanocytic nevi of left upper limb, including shoulder: Secondary | ICD-10-CM | POA: Diagnosis not present

## 2017-03-06 ENCOUNTER — Other Ambulatory Visit: Payer: Self-pay | Admitting: Internal Medicine

## 2017-03-06 DIAGNOSIS — E038 Other specified hypothyroidism: Secondary | ICD-10-CM

## 2017-03-18 ENCOUNTER — Encounter: Payer: Self-pay | Admitting: Internal Medicine

## 2017-03-18 ENCOUNTER — Other Ambulatory Visit (INDEPENDENT_AMBULATORY_CARE_PROVIDER_SITE_OTHER): Payer: BLUE CROSS/BLUE SHIELD

## 2017-03-18 ENCOUNTER — Ambulatory Visit: Payer: BLUE CROSS/BLUE SHIELD | Admitting: Internal Medicine

## 2017-03-18 VITALS — BP 144/80 | HR 105 | Temp 98.4°F | Resp 16 | Ht 65.0 in | Wt 210.0 lb

## 2017-03-18 DIAGNOSIS — I10 Essential (primary) hypertension: Secondary | ICD-10-CM | POA: Diagnosis not present

## 2017-03-18 DIAGNOSIS — E559 Vitamin D deficiency, unspecified: Secondary | ICD-10-CM | POA: Diagnosis not present

## 2017-03-18 DIAGNOSIS — M791 Myalgia, unspecified site: Secondary | ICD-10-CM | POA: Diagnosis not present

## 2017-03-18 DIAGNOSIS — E039 Hypothyroidism, unspecified: Secondary | ICD-10-CM

## 2017-03-18 LAB — CBC WITH DIFFERENTIAL/PLATELET
BASOS ABS: 0.1 10*3/uL (ref 0.0–0.1)
Basophils Relative: 0.9 % (ref 0.0–3.0)
EOS ABS: 0.2 10*3/uL (ref 0.0–0.7)
Eosinophils Relative: 1.8 % (ref 0.0–5.0)
HEMATOCRIT: 41.3 % (ref 36.0–46.0)
HEMOGLOBIN: 14.1 g/dL (ref 12.0–15.0)
LYMPHS PCT: 30.9 % (ref 12.0–46.0)
Lymphs Abs: 3.1 10*3/uL (ref 0.7–4.0)
MCHC: 34.2 g/dL (ref 30.0–36.0)
MCV: 87.1 fl (ref 78.0–100.0)
MONOS PCT: 7.6 % (ref 3.0–12.0)
Monocytes Absolute: 0.8 10*3/uL (ref 0.1–1.0)
NEUTROS ABS: 5.9 10*3/uL (ref 1.4–7.7)
Neutrophils Relative %: 58.8 % (ref 43.0–77.0)
PLATELETS: 365 10*3/uL (ref 150.0–400.0)
RBC: 4.74 Mil/uL (ref 3.87–5.11)
RDW: 13.6 % (ref 11.5–15.5)
WBC: 9.9 10*3/uL (ref 4.0–10.5)

## 2017-03-18 LAB — COMPREHENSIVE METABOLIC PANEL
ALK PHOS: 87 U/L (ref 39–117)
ALT: 22 U/L (ref 0–35)
AST: 13 U/L (ref 0–37)
Albumin: 4.2 g/dL (ref 3.5–5.2)
BILIRUBIN TOTAL: 0.3 mg/dL (ref 0.2–1.2)
BUN: 12 mg/dL (ref 6–23)
CALCIUM: 9.9 mg/dL (ref 8.4–10.5)
CO2: 28 meq/L (ref 19–32)
CREATININE: 0.83 mg/dL (ref 0.40–1.20)
Chloride: 102 mEq/L (ref 96–112)
GFR: 75.87 mL/min (ref >60.00)
Glucose, Bld: 104 mg/dL — ABNORMAL HIGH (ref 70–99)
Potassium: 3.9 mEq/L (ref 3.5–5.1)
Sodium: 138 mEq/L (ref 135–145)
Total Protein: 7.4 g/dL (ref 6.0–8.3)

## 2017-03-18 LAB — SEDIMENTATION RATE: SED RATE: 11 mm/h (ref 0–30)

## 2017-03-18 LAB — VITAMIN D 25 HYDROXY (VIT D DEFICIENCY, FRACTURES): VITD: 19.58 ng/mL — AB (ref 30.00–100.00)

## 2017-03-18 LAB — CK: Total CK: 54 U/L (ref 7–177)

## 2017-03-18 LAB — TSH: TSH: 5.76 u[IU]/mL — AB (ref 0.35–4.50)

## 2017-03-18 MED ORDER — LEVOTHYROXINE SODIUM 100 MCG PO TABS
100.0000 ug | ORAL_TABLET | Freq: Every day | ORAL | 1 refills | Status: DC
Start: 2017-03-18 — End: 2017-05-04

## 2017-03-18 MED ORDER — CHOLECALCIFEROL 1.25 MG (50000 UT) PO TABS: 1.0000 | ORAL_TABLET | ORAL | 1 refills | Status: AC

## 2017-03-18 NOTE — Patient Instructions (Signed)
Hypothyroidism Hypothyroidism is a disorder of the thyroid. The thyroid is a large gland that is located in the lower front of the neck. The thyroid releases hormones that control how the body works. With hypothyroidism, the thyroid does not make enough of these hormones. What are the causes? Causes of hypothyroidism may include:  Viral infections.  Pregnancy.  Your own defense system (immune system) attacking your thyroid.  Certain medicines.  Birth defects.  Past radiation treatments to your head or neck.  Past treatment with radioactive iodine.  Past surgical removal of part or all of your thyroid.  Problems with the gland that is located in the center of your brain (pituitary).  What are the signs or symptoms? Signs and symptoms of hypothyroidism may include:  Feeling as though you have no energy (lethargy).  Inability to tolerate cold.  Weight gain that is not explained by a change in diet or exercise habits.  Dry skin.  Coarse hair.  Menstrual irregularity.  Slowing of thought processes.  Constipation.  Sadness or depression.  How is this diagnosed? Your health care provider may diagnose hypothyroidism with blood tests and ultrasound tests. How is this treated? Hypothyroidism is treated with medicine that replaces the hormones that your body does not make. After you begin treatment, it may take several weeks for symptoms to go away. Follow these instructions at home:  Take medicines only as directed by your health care provider.  If you start taking any new medicines, tell your health care provider.  Keep all follow-up visits as directed by your health care provider. This is important. As your condition improves, your dosage needs may change. You will need to have blood tests regularly so that your health care provider can watch your condition. Contact a health care provider if:  Your symptoms do not get better with treatment.  You are taking thyroid  replacement medicine and: ? You sweat excessively. ? You have tremors. ? You feel anxious. ? You lose weight rapidly. ? You cannot tolerate heat. ? You have emotional swings. ? You have diarrhea. ? You feel weak. Get help right away if:  You develop chest pain.  You develop an irregular heartbeat.  You develop a rapid heartbeat. This information is not intended to replace advice given to you by your health care provider. Make sure you discuss any questions you have with your health care provider. Document Released: 01/05/2005 Document Revised: 06/13/2015 Document Reviewed: 05/23/2013 Elsevier Interactive Patient Education  2018 [employer]  

## 2017-03-18 NOTE — Progress Notes (Signed)
Subjective:  Patient ID: Cynthia Craig, female    DOB: 1960/05/25  Age: 57 y.o. MRN: [id number]  CC: Hypertension and Hypothyroidism   HPI ZAIRA IACOVELLI presents for f/up - She complains of a several month history of diffuse muscle and joint aches with weight gain, diffuse weakness, and fatigue.  She is being treated for an MRSA skin infection by a dermatologist with doxycycline.  She is using her CPAP machine.  She does not consistently take her vitamin D supplement.  She is compliant with her other meds.  She tells me her blood pressure has been well controlled.  Outpatient Medications Prior to Visit  Medication Sig Dispense Refill  . albuterol (PROVENTIL HFA;VENTOLIN HFA) 108 (90 BASE) MCG/ACT inhaler Inhale 2 puffs into the lungs every 6 (six) hours as needed for wheezing or shortness of breath. 1 Inhaler 1  . cetirizine (ZYRTEC) 10 MG tablet Take 1 tablet (10 mg total) by mouth daily. 90 tablet 3  . chlorthalidone (HYGROTON) 25 MG tablet Take 1 tablet (25 mg total) by mouth daily. 90 tablet 1  . mometasone (NASONEX) 50 MCG/ACT nasal spray Place 4 sprays into the nose daily. (Patient taking differently: Place 4 sprays into the nose daily as needed. ) 17 g 12  . telmisartan (MICARDIS) 80 MG tablet Take 1 tablet (80 mg total) by mouth daily. 90 tablet 1  . Cholecalciferol 50000 units TABS Take 1 tablet by mouth once a week. 12 tablet 3  . levothyroxine (SYNTHROID, LEVOTHROID) 75 MCG tablet Take 1 tablet (75 mcg total) by mouth daily. 90 tablet 1   No facility-administered medications prior to visit.     ROS Review of Systems  Constitutional: Positive for fatigue and unexpected weight change. Negative for appetite change, chills, diaphoresis and fever.  HENT: Negative.  Negative for sore throat and trouble swallowing.   Eyes: Negative for visual disturbance.  Respiratory: Negative for cough, chest tightness, shortness of breath and wheezing.   Cardiovascular: Negative for chest pain,  palpitations and leg swelling.  Gastrointestinal: Negative for abdominal pain, blood in stool, constipation, diarrhea, nausea and vomiting.  Endocrine: Negative.  Negative for cold intolerance and heat intolerance.  Genitourinary: Negative.  Negative for difficulty urinating, dysuria and hematuria.  Musculoskeletal: Positive for arthralgias and myalgias. Negative for gait problem, joint swelling and neck pain.  Skin: Negative.  Negative for color change, pallor and rash.  Allergic/Immunologic: Negative.   Neurological: Negative.  Negative for dizziness, weakness, light-headedness and numbness.  Hematological: Negative for adenopathy. Does not bruise/bleed easily.  Psychiatric/Behavioral: Negative.  Negative for dysphoric mood and sleep disturbance. The patient is not nervous/anxious.     Objective:  BP (!) 144/80 (BP Location: Left Arm, Patient Position: Sitting, Cuff Size: Large)   Pulse (!) 105   Temp 98.4 F (36.9 C) (Oral)   Resp 16   Ht 5\' 5"  (1.651 m)   Wt 210 lb (95.3 kg)   SpO2 96%   BMI 34.95 kg/m   BP Readings from Last 3 Encounters:  03/18/17 (!) 144/80  01/04/17 (!) 150/70  10/02/16 (!) 129/93    Wt Readings from Last 3 Encounters:  03/18/17 210 lb (95.3 kg)  01/04/17 203 lb 3.2 oz (92.2 kg)  07/30/16 189 lb (85.7 kg)    Physical Exam  Constitutional: She is oriented to person, place, and time. No distress.  HENT:  Mouth/Throat: Oropharynx is clear and moist. No oropharyngeal exudate.  Eyes: Conjunctivae are normal. Left eye exhibits no discharge. No  scleral icterus.  Neck: Normal range of motion. Neck supple. No JVD present. No thyromegaly present.  Cardiovascular: Normal rate, regular rhythm and normal heart sounds. Exam reveals no gallop.  No murmur heard. Pulmonary/Chest: Effort normal and breath sounds normal. No respiratory distress. She has no wheezes. She has no rales.  Abdominal: Soft. Bowel sounds are normal. She exhibits no distension and no mass.  There is no tenderness. There is no guarding.  Musculoskeletal: Normal range of motion. She exhibits no edema, tenderness or deformity.  Lymphadenopathy:    She has no cervical adenopathy.  Neurological: She is alert and oriented to person, place, and time.  Skin: Skin is warm and dry. No rash noted. She is not diaphoretic. No erythema. No pallor.  Psychiatric: She has a normal mood and affect. Her behavior is normal. Judgment and thought content normal.  Vitals reviewed.   Lab Results  Component Value Date   WBC 9.9 03/18/2017   HGB 14.1 03/18/2017   HCT 41.3 03/18/2017   PLT 365.0 03/18/2017   GLUCOSE 104 (H) 03/18/2017   CHOL 213 (H) 06/23/2016   TRIG 223.0 (H) 06/23/2016   HDL 46.30 06/23/2016   LDLDIRECT 143.0 06/23/2016   LDLCALC 153 (H) 06/11/2015   ALT 22 03/18/2017   AST 13 03/18/2017   NA 138 03/18/2017   K 3.9 03/18/2017   CL 102 03/18/2017   CREATININE 0.83 03/18/2017   BUN 12 03/18/2017   CO2 28 03/18/2017   TSH 5.76 (H) 03/18/2017   HGBA1C 5.8 06/23/2016    No results found.  Assessment & Plan:   Jerolyn was seen today for hypertension and hypothyroidism.  Diagnoses and all orders for this visit:  Myalgia- Her exam and labs are reassuring that she does not have a myopathy or myositis.  Her electrolytes are normal.  She is not taking any medications that could contribute to this.  Her TSH is a little high and her vitamin D level is low so I am hopeful that treating these 2 entities will help with the muscle and joint aches. -     CK; Future -     Comprehensive metabolic panel; Future -     Sedimentation rate; Future  Acquired hypothyroidism- Her TSH is elevated at 5.76 and she is symptomatic.  I have therefore increased her dose of levothyroxine. -     TSH; Future -     levothyroxine (SYNTHROID, LEVOTHROID) 100 MCG tablet; Take 1 tablet (100 mcg total) by mouth daily.  HTN (hypertension), benign- Her blood pressure is well controlled.  Electrolytes and  renal function are normal. -     Comprehensive metabolic panel; Future -     CBC with Differential/Platelet; Future  Vitamin D deficiency- Her vitamin D level is low.  This may contribute to some of her symptoms.  I have asked her to restart the vitamin D supplement. -     VITAMIN D 25 Hydroxy (Vit-D Deficiency, Fractures); Future -     Cholecalciferol 50000 units TABS; Take 1 tablet by mouth once a week.   I have discontinued Dayleen Beske. Ethridge's levothyroxine. I am also having her start on levothyroxine. Additionally, I am having her maintain her albuterol, mometasone, cetirizine, telmisartan, chlorthalidone, and Cholecalciferol.  Meds ordered this encounter  Medications  . Cholecalciferol 50000 units TABS    Sig: Take 1 tablet by mouth once a week.    Dispense:  12 tablet    Refill:  1  . levothyroxine (SYNTHROID, LEVOTHROID) 100 MCG  tablet    Sig: Take 1 tablet (100 mcg total) by mouth daily.    Dispense:  90 tablet    Refill:  1     Follow-up: Return in about 2 months (around 05/16/2017).  Scarlette Calico, MD

## 2017-03-22 DIAGNOSIS — D485 Neoplasm of uncertain behavior of skin: Secondary | ICD-10-CM | POA: Diagnosis not present

## 2017-03-22 DIAGNOSIS — L988 Other specified disorders of the skin and subcutaneous tissue: Secondary | ICD-10-CM | POA: Diagnosis not present

## 2017-05-03 ENCOUNTER — Encounter: Payer: Self-pay | Admitting: Nurse Practitioner

## 2017-05-04 ENCOUNTER — Other Ambulatory Visit (INDEPENDENT_AMBULATORY_CARE_PROVIDER_SITE_OTHER): Payer: BLUE CROSS/BLUE SHIELD

## 2017-05-04 ENCOUNTER — Ambulatory Visit (INDEPENDENT_AMBULATORY_CARE_PROVIDER_SITE_OTHER): Payer: BLUE CROSS/BLUE SHIELD | Admitting: Internal Medicine

## 2017-05-04 ENCOUNTER — Encounter: Payer: Self-pay | Admitting: Internal Medicine

## 2017-05-04 VITALS — BP 154/78 | HR 98 | Temp 98.6°F | Ht 65.0 in | Wt 211.0 lb

## 2017-05-04 DIAGNOSIS — I10 Essential (primary) hypertension: Secondary | ICD-10-CM

## 2017-05-04 DIAGNOSIS — E039 Hypothyroidism, unspecified: Secondary | ICD-10-CM | POA: Diagnosis not present

## 2017-05-04 DIAGNOSIS — M51369 Other intervertebral disc degeneration, lumbar region without mention of lumbar back pain or lower extremity pain: Secondary | ICD-10-CM

## 2017-05-04 DIAGNOSIS — M170 Bilateral primary osteoarthritis of knee: Secondary | ICD-10-CM

## 2017-05-04 DIAGNOSIS — R739 Hyperglycemia, unspecified: Secondary | ICD-10-CM

## 2017-05-04 DIAGNOSIS — M5136 Other intervertebral disc degeneration, lumbar region: Secondary | ICD-10-CM | POA: Diagnosis not present

## 2017-05-04 DIAGNOSIS — M5416 Radiculopathy, lumbar region: Secondary | ICD-10-CM | POA: Diagnosis not present

## 2017-05-04 LAB — TSH: TSH: 5.58 u[IU]/mL — AB (ref 0.35–4.50)

## 2017-05-04 LAB — BASIC METABOLIC PANEL
BUN: 9 mg/dL (ref 6–23)
CALCIUM: 9.6 mg/dL (ref 8.4–10.5)
CO2: 29 mEq/L (ref 19–32)
Chloride: 102 mEq/L (ref 96–112)
Creatinine, Ser: 0.82 mg/dL (ref 0.40–1.20)
GFR: 76.91 mL/min (ref >60.00)
GLUCOSE: 113 mg/dL — AB (ref 70–99)
POTASSIUM: 3.9 meq/L (ref 3.5–5.1)
SODIUM: 138 meq/L (ref 135–145)

## 2017-05-04 LAB — HEMOGLOBIN A1C: Hgb A1c MFr Bld: 5.8 % (ref 4.6–6.5)

## 2017-05-04 MED ORDER — LEVOTHYROXINE SODIUM 125 MCG PO TABS: 125.0000 ug | ORAL_TABLET | Freq: Every day | ORAL | 1 refills | Status: DC

## 2017-05-04 MED ORDER — DICLOFENAC 18 MG PO CAPS: 1.0000 | ORAL_CAPSULE | Freq: Three times a day (TID) | ORAL | 5 refills | Status: DC | PRN

## 2017-05-04 NOTE — Patient Instructions (Signed)

## 2017-05-04 NOTE — Progress Notes (Signed)
Subjective:  Patient ID: Cynthia Craig, female    DOB: [birth year]  Age: 57 y.o. MRN: [id number]  CC: Back Pain; Osteoarthritis; Hypertension; and Hypothyroidism   HPI Cynthia Craig presents for f/up - she complains of chronic, worsening low back pain that occasionally radiates into her thighs.  She had plain films done about a year and a half ago that showed DDD.  The pain keeps her awake at night and interferes with her activities during the day.  She describes it as an intermittently dull and throbbing sensation.  She takes up to a gram of Tylenol at a time without any symptom relief.  She denies any recent trauma or injury and denies paresthesias in her lower extremities.  Outpatient Medications Prior to Visit  Medication Sig Dispense Refill  . albuterol (PROVENTIL HFA;VENTOLIN HFA) 108 (90 BASE) MCG/ACT inhaler Inhale 2 puffs into the lungs every 6 (six) hours as needed for wheezing or shortness of breath. 1 Inhaler 1  . cetirizine (ZYRTEC) 10 MG tablet Take 1 tablet (10 mg total) by mouth daily. 90 tablet 3  . Cholecalciferol 50000 units TABS Take 1 tablet by mouth once a week. (Patient taking differently: Take 1 capsule by mouth once a week. ) 12 tablet 1  . mometasone (NASONEX) 50 MCG/ACT nasal spray Place 4 sprays into the nose daily. (Patient taking differently: Place 4 sprays into the nose daily as needed. ) 17 g 12  . telmisartan (MICARDIS) 80 MG tablet Take 1 tablet (80 mg total) by mouth daily. 90 tablet 1  . levothyroxine (SYNTHROID, LEVOTHROID) 100 MCG tablet Take 1 tablet (100 mcg total) by mouth daily. 90 tablet 1  . chlorthalidone (HYGROTON) 25 MG tablet Take 1 tablet (25 mg total) by mouth daily. (Patient not taking: Reported on 05/04/2017) 90 tablet 1   No facility-administered medications prior to visit.     ROS Review of Systems  Constitutional: Positive for unexpected weight change (wt gain). Negative for appetite change, diaphoresis, fatigue and fever.  HENT:  Negative.  Negative for trouble swallowing.   Eyes: Negative for visual disturbance.  Respiratory: Negative for cough, chest tightness, shortness of breath and wheezing.   Cardiovascular: Negative for chest pain, palpitations and leg swelling.  Gastrointestinal: Negative for abdominal pain, constipation, diarrhea, nausea and vomiting.  Endocrine: Negative for cold intolerance and heat intolerance.  Genitourinary: Negative.  Negative for difficulty urinating.  Musculoskeletal: Positive for arthralgias and back pain. Negative for myalgias and neck pain.  Skin: Negative.   Allergic/Immunologic: Negative.   Neurological: Negative.  Negative for dizziness, weakness, light-headedness and numbness.  Hematological: Negative for adenopathy. Does not bruise/bleed easily.  Psychiatric/Behavioral: Negative.     Objective:  BP (!) 154/78 (BP Location: Left Arm, Patient Position: Sitting, Cuff Size: Large)   Pulse 98   Temp 98.6 F (37 C) (Oral)   Ht 5\' 5"  (1.651 m)   Wt 211 lb (95.7 kg)   SpO2 96%   BMI 35.11 kg/m   BP Readings from Last 3 Encounters:  05/05/17 (!) 146/98  05/04/17 (!) 154/78  03/18/17 (!) 144/80    Wt Readings from Last 3 Encounters:  05/05/17 211 lb 9.6 oz (96 kg)  05/04/17 211 lb (95.7 kg)  03/18/17 210 lb (95.3 kg)    Physical Exam  Constitutional: She is oriented to person, place, and time. No distress.  HENT:  Mouth/Throat: No oropharyngeal exudate.  Eyes: Conjunctivae are normal. Left eye exhibits no discharge. No scleral icterus.  Neck: Normal  range of motion. Neck supple.  Cardiovascular: Normal rate, regular rhythm and normal heart sounds. Exam reveals no gallop.  No murmur heard. Pulmonary/Chest: Effort normal and breath sounds normal. No stridor. No respiratory distress. She has no wheezes. She has no rales.  Abdominal: Soft. Bowel sounds are normal. She exhibits no mass. There is no tenderness. There is no guarding.  Musculoskeletal: She exhibits no  edema, tenderness or deformity.       Lumbar back: Normal. She exhibits normal range of motion, no tenderness, no bony tenderness, no edema, no deformity, no pain and no spasm.  Lymphadenopathy:    She has no cervical adenopathy.  Neurological: She is alert and oriented to person, place, and time. She has normal strength and normal reflexes. She displays no atrophy and no tremor. No sensory deficit. She exhibits normal muscle tone. She displays a negative Romberg sign. She displays no seizure activity. Coordination and gait normal.  Neg SLR in BLE  Skin: Skin is warm and dry. She is not diaphoretic.  Vitals reviewed.   Lab Results  Component Value Date   WBC 9.9 03/18/2017   HGB 14.1 03/18/2017   HCT 41.3 03/18/2017   PLT 365.0 03/18/2017   GLUCOSE 113 (H) 05/04/2017   CHOL 213 (H) 06/23/2016   TRIG 223.0 (H) 06/23/2016   HDL 46.30 06/23/2016   LDLDIRECT 143.0 06/23/2016   LDLCALC 153 (H) 06/11/2015   ALT 22 03/18/2017   AST 13 03/18/2017   NA 138 05/04/2017   K 3.9 05/04/2017   CL 102 05/04/2017   CREATININE 0.82 05/04/2017   BUN 9 05/04/2017   CO2 29 05/04/2017   TSH 5.58 (H) 05/04/2017   HGBA1C 5.8 05/04/2017    No results found.  Assessment & Plan:   Kairie was seen today for back pain, osteoarthritis, hypertension and hypothyroidism.  Diagnoses and all orders for this visit:  HTN (hypertension), benign-blood pressure is not adequately well controlled.  She agrees to work on her lifestyle modifications. -     Basic metabolic panel; Future  Acquired hypothyroidism-TSH remains elevated and she is symptomatic.  I have asked her to increase her dose of levothyroxine. -     TSH; Future -     levothyroxine (SYNTHROID, LEVOTHROID) 125 MCG tablet; Take 1 tablet (125 mcg total) by mouth daily.  Hyperglycemia-her A1c is at 5.8%.  She is mildly prediabetic.  Medical therapy is not indicated. -     Basic metabolic panel; Future -     Hemoglobin A1c; Future  Primary  osteoarthritis of both knees -     Discontinue: Diclofenac (ZORVOLEX) 18 MG CAPS; Take 1 capsule by mouth 3 (three) times daily with meals as needed. (Patient not taking: Reported on 05/05/2017)  DDD (degenerative disc disease), lumbar -     Discontinue: Diclofenac (ZORVOLEX) 18 MG CAPS; Take 1 capsule by mouth 3 (three) times daily with meals as needed. (Patient not taking: Reported on 05/05/2017) -     Ambulatory referral to Physical Therapy -     MR Lumbar Spine Wo Contrast; Future  Lumbar radiculitis- Will start treating the pain with an anti-inflammatory.  I have asked her to start doing physical therapy for relief of the pain.  Will also get an MRI of the lumbar spine to see if there is spinal stenosis, nerve impingement, or disc herniation. -     Discontinue: Diclofenac (ZORVOLEX) 18 MG CAPS; Take 1 capsule by mouth 3 (three) times daily with meals as needed. (Patient not  taking: Reported on 05/05/2017) -     Ambulatory referral to Physical Therapy -     MR Lumbar Spine Wo Contrast; Future   I have discontinued Sharolyn Weber. Kush's chlorthalidone and levothyroxine. I am also having her start on levothyroxine. Additionally, I am having her maintain her albuterol, mometasone, cetirizine, telmisartan, and Cholecalciferol.  Meds ordered this encounter  Medications  . DISCONTD: Diclofenac (ZORVOLEX) 18 MG CAPS    Sig: Take 1 capsule by mouth 3 (three) times daily with meals as needed.    Dispense:  90 capsule    Refill:  5  . levothyroxine (SYNTHROID, LEVOTHROID) 125 MCG tablet    Sig: Take 1 tablet (125 mcg total) by mouth daily.    Dispense:  90 tablet    Refill:  1     Follow-up: Return in about 6 weeks (around 06/15/2017).  Scarlette Calico, MD

## 2017-05-04 NOTE — Progress Notes (Signed)
[facility]  PATIENT: INDIE BOEHNE DOB: [birth year]   REASON FOR VISIT: Follow-up for obstructive sleep apnea with  CPAP compliance HISTORY FROM:patient    HISTORY OF PRESENT ILLNESS:UPDATE 4/17/2019CM [name], 57 year old female returns for follow-up for obstructive sleep apnea here for CPAP compliance.  Data dated 04/04/2017-05/03/2017 shows greater than 4 hours at 63% for 19 days.  Average usage 5 hours 21 minutes.  Set pressure 9 cm.  EPR level 3 AHI 0.6.  Leak 95th percentile 6.6.  ESS 3.  Patient states she was out of town a couple of days and she also had a sinus infection for a few days and did not use her machine.  Reiterated that she needs to be at least 70% compliance.  She returns for reevaluation    UPDATE 12/17/2018CM [name], 57 year old female returns for follow-up with sleep study showing obstructive sleep apnea.  She is here for her first CPAP compliance.  Data dated 12/05/2016-01/03/2017 shows greater than 4 hours for 20 days at 67% average usage 4 hours 48 minutes.  Set pressure 9 cm.  EPR level 3.  AHI 0.5.  ESS score 6.  He says she is having much less daytime drowsiness now with her CPAP he does find herself taking it off at night still says she is getting adjusted to it She returns for reevaluation  07/30/16 CDSusan S Artiaga is a 57 y.o. female , seen here as in a referral  from Dr. Ronnald Ramp for a sleep apnea evaluation,  Chief complaint according to patient : "my husband has witnessed me to toss and turn, I don't get restful sleep, and I snore"  Mrs. Charon is a 57 year old married Caucasian lady who presents today to be evaluated if her intermittent headaches and sometimes poorly controlled blood pressure may be related to an underlying sleep apnea condition. She is in the process of losing weight with diet and exercise, she occasionally takes Motrin for joint pain, and she does not have complaints such as ankle edema, shortness of breath, chest  pain, blurred vision or loss of vision or diplopia. Her husband however has sometimes move to a different room because of her noring at night. She has used albuterol inhalers as needed for wheezing and shortness of breath, she has allergic rhinitis and takes Zyrtec during allergy season, she has been on vitamin D supplements on Nasonex and on Tessalon capsules. She is also treated for hypothyroidism and has been on Micardis, Robaxin, prednisolone, Lodi and and Viberzi ( IBS).   Sleep habits are as follows: The patient usually goes to bed between 10:30 and 11:30 PM, most evenings she will have watched the news on TV before going to the bedroom. She does not have trouble initiating sleep, usually between 2:30 AM and 4 AM due to the need to urinate. She usually has no more bathroom breaks after that, she can go back to sleep, but the second half of the night is more restless and not as restorative. She describes herself as a site and belly sleeper, she uses 2 pillows for head support and 1 for body support. She feels that she dreams vividly and a lot, and sometimes will wake up from a dream. She is not known to act out dreams or have sleep walked. She is usually spontaneously awake before her alarm clock readings at 6 AM. She wakes frequently with headaches, these are occipital headaches and sometimes appears cervicogenic, radiating form the nape of the neck.  REVIEW OF SYSTEMS: Full 14 system review of systems performed and notable only for those listed, all others are neg:  Constitutional: neg  Cardiovascular: neg Ear/Nose/Throat: neg  Skin: neg Eyes: neg Respiratory: neg Gastroitestinal: neg  Hematology/Lymphatic: neg  Endocrine: neg Musculoskeletal: Back pain Allergy/Immunology: Environmental allergies Neurological: neg Psychiatric: neg Sleep : Instructive sleep apnea with CPAP   ALLERGIES: Allergies  Allergen Reactions  . Contrave [Naltrexone-Bupropion Hcl Er] Other (See  Comments)    N/V  . Codeine Nausea And Vomiting    HOME MEDICATIONS: Outpatient Medications Prior to Visit  Medication Sig Dispense Refill  . albuterol (PROVENTIL HFA;VENTOLIN HFA) 108 (90 BASE) MCG/ACT inhaler Inhale 2 puffs into the lungs every 6 (six) hours as needed for wheezing or shortness of breath. 1 Inhaler 1  . cetirizine (ZYRTEC) 10 MG tablet Take 1 tablet (10 mg total) by mouth daily. 90 tablet 3  . Cholecalciferol 50000 units TABS Take 1 tablet by mouth once a week. (Patient taking differently: Take 1 capsule by mouth once a week. ) 12 tablet 1  . levothyroxine (SYNTHROID, LEVOTHROID) 125 MCG tablet Take 1 tablet (125 mcg total) by mouth daily. 90 tablet 1  . mometasone (NASONEX) 50 MCG/ACT nasal spray Place 4 sprays into the nose daily. (Patient taking differently: Place 4 sprays into the nose daily as needed. ) 17 g 12  . telmisartan (MICARDIS) 80 MG tablet Take 1 tablet (80 mg total) by mouth daily. 90 tablet 1  . D3-50 50000 units capsule Take 50,000 Units by mouth once a week.  1  . Diclofenac (ZORVOLEX) 18 MG CAPS Take 1 capsule by mouth 3 (three) times daily with meals as needed. (Patient not taking: Reported on 05/05/2017) 90 capsule 5   No facility-administered medications prior to visit.     PAST MEDICAL HISTORY: Past Medical History:  Diagnosis Date  . Allergy    codeine  . Arthritis    rt knee  . GERD (gastroesophageal reflux disease)   . Hypertension   . Hypothyroidism     PAST SURGICAL HISTORY: Past Surgical History:  Procedure Laterality Date  . ABDOMINAL HYSTERECTOMY  2001  . BLADDER REPAIR  2009   mesh / prolapsed bladder  . CESAREAN SECTION  1995  . CHOLECYSTECTOMY  July 2013    FAMILY HISTORY: Family History  Problem Relation Age of Onset  . Diabetes Mother   . Heart disease Mother        congestive heart failure  . Asthma Mother   . Hypertension Mother   . Kidney disease Mother   . Cancer Maternal Grandmother 80       stomach  .  Cancer Maternal Grandfather 80       lung  . Thyroid disease Other   . Stomach cancer Paternal Grandmother   . Colon cancer Neg Hx   . Pancreatic cancer Neg Hx     SOCIAL HISTORY: Social History   Socioeconomic History  . Marital status: Married    Spouse name: Not on file  . Number of children: Not on file  . Years of education: Not on file  . Highest education level: Not on file  Occupational History  . Occupation: Lexicographer: [employer]  . Financial resource strain: Not on file  . Food insecurity:    Worry: Not on file    Inability: Not on file  . Transportation needs:    Medical: Not on file    Non-medical: Not  on file  Tobacco Use  . Smoking status: Never Smoker  . Smokeless tobacco: Never Used  Substance and Sexual Activity  . Alcohol use: No  . Drug use: No  . Sexual activity: Not on file  Lifestyle  . Physical activity:    Days per week: Not on file    Minutes per session: Not on file  . Stress: Not on file  Relationships  . Social connections:    Talks on phone: Not on file    Gets together: Not on file    Attends religious service: Not on file    Active member of club or organization: Not on file    Attends meetings of clubs or organizations: Not on file    Relationship status: Not on file  . Intimate partner violence:    Fear of current or ex partner: Not on file    Emotionally abused: Not on file    Physically abused: Not on file    Forced sexual activity: Not on file  Other Topics Concern  . Not on file  Social History Narrative   Regular Exercise -  YES           PHYSICAL EXAM  Vitals:   05/05/17 0738  BP: (!) 146/98  Pulse: 90  Weight: 211 lb 9.6 oz (96 kg)  Height: 5' 5" (1.651 m)   Body mass index is 35.21 kg/m.  Generalized: Well developed, in no acute distress  Head: normocephalic and atraumatic,. Oropharynx benign mallopati 5 Neck: Supple, circumference 17 Musculoskeletal: No deformity    Neurological examination   Mentation: Alert oriented to time, place, history taking. Attention span and concentration appropriate. Recent and remote memory intact.  Follows all commands speech and language fluent. ESS 3 Cranial nerve II-XII: Pupils were equal round reactive to light extraocular movements were full, visual field were full on confrontational test. Facial sensation and strength were normal. hearing was intact to finger rubbing bilaterally. Uvula tongue midline. head turning and shoulder shrug were normal and symmetric.Tongue protrusion into cheek strength was normal. Motor: normal bulk and tone, full strength in the BUE, BLE,  Sensory: normal and symmetric to light touch,  Coordination: finger-nose-finger, heel-to-shin bilaterally, no dysmetria Gait and Station: Rising up from seated position without assistance, normal stance,  moderate stride, good arm swing, smooth turning, able to perform tiptoe, and heel walking without difficulty. Tandem gait is steady  DIAGNOSTIC DATA (LABS, IMAGING, TESTING) - I reviewed patient records, labs, notes, testing and imaging myself where available.  Lab Results  Component Value Date   WBC 9.9 03/18/2017   HGB 14.1 03/18/2017   HCT 41.3 03/18/2017   MCV 87.1 03/18/2017   PLT 365.0 03/18/2017      Component Value Date/Time   NA 138 05/04/2017 1339   K 3.9 05/04/2017 1339   CL 102 05/04/2017 1339   CO2 29 05/04/2017 1339   GLUCOSE 113 (H) 05/04/2017 1339   BUN 9 05/04/2017 1339   CREATININE 0.82 05/04/2017 1339   CALCIUM 9.6 05/04/2017 1339   PROT 7.4 03/18/2017 1624   ALBUMIN 4.2 03/18/2017 1624   AST 13 03/18/2017 1624   ALT 22 03/18/2017 1624   ALKPHOS 87 03/18/2017 1624   BILITOT 0.3 03/18/2017 1624   GFRNONAA 78.06 01/08/2010 1327   GFRAA  03/16/2008 0915    >60        The eGFR has been calculated using the MDRD equation. This calculation has not been validated in all clinical situations. eGFR's persistently <  60  mL/min signify possible Chronic Kidney Disease.   Lab Results  Component Value Date   CHOL 213 (H) 06/23/2016   HDL 46.30 06/23/2016   LDLCALC 153 (H) 06/11/2015   LDLDIRECT 143.0 06/23/2016   TRIG 223.0 (H) 06/23/2016   CHOLHDL 5 06/23/2016   Lab Results  Component Value Date   HGBA1C 5.8 05/04/2017    Lab Results  Component Value Date   TSH 5.58 (H) 05/04/2017      ASSESSMENT AND PLAN  57 y.o. year old female recently diagnosed with obstructive sleep apnea here for her  compliance.  Data dated 04/04/2017-05/03/2017 shows greater than 4 hours at 63% for 19 days.  Average usage 5 hours 21 minutes.  Set pressure 9 cm.  EPR level 3 AHI 0.6.  Leak 95th percentile 6.6.  ESS 3.  CPAP compliance 63% greater than 4 hours, reviewed data with patient, made her aware it needs to be at least 70% Continue same settings Follow-up in 3 months for repeat compliance Dennie Bible, [facility], [facility], APRN  [facility] [address], [place], [place] [address] [phone]

## 2017-05-05 ENCOUNTER — Ambulatory Visit: Payer: BLUE CROSS/BLUE SHIELD | Admitting: Nurse Practitioner

## 2017-05-05 ENCOUNTER — Encounter: Payer: Self-pay | Admitting: Nurse Practitioner

## 2017-05-05 VITALS — BP 146/98 | HR 90 | Ht 65.0 in | Wt 211.6 lb

## 2017-05-05 DIAGNOSIS — G4733 Obstructive sleep apnea (adult) (pediatric): Secondary | ICD-10-CM | POA: Diagnosis not present

## 2017-05-05 DIAGNOSIS — Z9989 Dependence on other enabling machines and devices: Secondary | ICD-10-CM | POA: Diagnosis not present

## 2017-05-05 NOTE — Patient Instructions (Signed)
CPAP compliance 63% greater than 4 hours Continue same settings Follow-up in 3 months for repeat compliance

## 2017-05-22 ENCOUNTER — Ambulatory Visit
Admission: RE | Admit: 2017-05-22 | Discharge: 2017-05-22 | Disposition: A | Discharge status: Home / Self Care | Payer: BLUE CROSS/BLUE SHIELD | Source: Ambulatory Visit | Attending: Internal Medicine | Admitting: Internal Medicine

## 2017-05-22 DIAGNOSIS — M545 Low back pain: Secondary | ICD-10-CM | POA: Diagnosis not present

## 2017-05-22 DIAGNOSIS — M5416 Radiculopathy, lumbar region: Secondary | ICD-10-CM

## 2017-05-22 DIAGNOSIS — M5136 Other intervertebral disc degeneration, lumbar region: Secondary | ICD-10-CM

## 2017-05-23 ENCOUNTER — Encounter: Payer: Self-pay | Admitting: Internal Medicine

## 2017-08-12 ENCOUNTER — Other Ambulatory Visit: Payer: Self-pay | Admitting: Internal Medicine

## 2017-08-12 ENCOUNTER — Ambulatory Visit: Payer: BLUE CROSS/BLUE SHIELD | Admitting: Nurse Practitioner

## 2017-08-12 DIAGNOSIS — J301 Allergic rhinitis due to pollen: Secondary | ICD-10-CM

## 2017-08-23 NOTE — Progress Notes (Signed)
[facility]  PATIENT: Cynthia Craig DOB: May 04, 1960   REASON FOR VISIT: Follow-up for obstructive sleep apnea with  CPAP compliance HISTORY FROM:patient    HISTORY OF PRESENT ILLNESS:UPDATE 8/6/2019CM [name], 57 year old female returns for follow-up with obstructive sleep apnea he is here for CPAP compliance.  Data dated 07/24/2017-08/22/2017 shows compliance greater than 4 hours at 87%.  The other 4 days that she did not use the machine she was at the hospital with her dying father-in-law.  Average usage 6 hours 21 minutes.  Set pressure 9 cm.  Leak 95th percentile 9.2.  AHI 0.5 ESS 2.  She returns for reevaluation   UPDATE 4/17/2019CM [name], 57 year old female returns for follow-up for obstructive sleep apnea here for CPAP compliance.  Data dated 04/04/2017-05/03/2017 shows greater than 4 hours at 63% for 19 days.  Average usage 5 hours 21 minutes.  Set pressure 9 cm.  EPR level 3 AHI 0.6.  Leak 95th percentile 6.6.  ESS 3.  Patient states she was out of town a couple of days and she also had a sinus infection for a few days and did not use her machine.  Reiterated that she needs to be at least 70% compliance.  She returns for reevaluation    UPDATE 12/17/2018CM [name], 57 year old female returns for follow-up with sleep study showing obstructive sleep apnea.  She is here for her first CPAP compliance.  Data dated 12/05/2016-01/03/2017 shows greater than 4 hours for 20 days at 67% average usage 4 hours 48 minutes.  Set pressure 9 cm.  EPR level 3.  AHI 0.5.  ESS score 6.  He says she is having much less daytime drowsiness now with her CPAP he does find herself taking it off at night still says she is getting adjusted to it She returns for reevaluation  07/30/16 CDSusan S Craig is a 57 y.o. female , seen here as in a referral  from Dr. Ronnald Ramp for a sleep apnea evaluation,  Chief complaint according to patient : "my husband has witnessed me to toss and turn, I  don't get restful sleep, and I snore"  [name] is a 57 year old married Caucasian lady who presents today to be evaluated if her intermittent headaches and sometimes poorly controlled blood pressure may be related to an underlying sleep apnea condition. She is in the process of losing weight with diet and exercise, she occasionally takes Motrin for joint pain, and she does not have complaints such as ankle edema, shortness of breath, chest pain, blurred vision or loss of vision or diplopia. Her husband however has sometimes move to a different room because of her noring at night. She has used albuterol inhalers as needed for wheezing and shortness of breath, she has allergic rhinitis and takes Zyrtec during allergy season, she has been on vitamin D supplements on Nasonex and on Tessalon capsules. She is also treated for hypothyroidism and has been on Micardis, Robaxin, prednisolone, Lodi and and Viberzi ( IBS).   Sleep habits are as follows: The patient usually goes to bed between 10:30 and 11:30 PM, most evenings she will have watched the news on TV before going to the bedroom. She does not have trouble initiating sleep, usually between 2:30 AM and 4 AM due to the need to urinate. She usually has no more bathroom breaks after that, she can go back to sleep, but the second half of the night is more restless and not as restorative. She describes herself as a  site and belly sleeper, she uses 2 pillows for head support and 1 for body support. She feels that she dreams vividly and a lot, and sometimes will wake up from a dream. She is not known to act out dreams or have sleep walked. She is usually spontaneously awake before her alarm clock readings at 6 AM. She wakes frequently with headaches, these are occipital headaches and sometimes appears cervicogenic, radiating form the nape of the neck.     REVIEW OF SYSTEMS: Full 14 system review of systems performed and notable only for those listed, all  others are neg:  Constitutional: neg  Cardiovascular: neg Ear/Nose/Throat: neg  Skin: neg Eyes: neg Respiratory: neg Gastroitestinal: neg  Hematology/Lymphatic: neg  Endocrine: neg Musculoskeletal: neg Allergy/Immunology: Environmental allergies Neurological: neg Psychiatric: neg Sleep : Obstructive sleep apnea with CPAP   ALLERGIES: Allergies  Allergen Reactions  . Contrave [Naltrexone-Bupropion Hcl Er] Other (See Comments)    N/V  . Codeine Nausea And Vomiting    HOME MEDICATIONS: Outpatient Medications Prior to Visit  Medication Sig Dispense Refill  . albuterol (PROVENTIL HFA;VENTOLIN HFA) 108 (90 BASE) MCG/ACT inhaler Inhale 2 puffs into the lungs every 6 (six) hours as needed for wheezing or shortness of breath. 1 Inhaler 1  . cetirizine (ZYRTEC) 10 MG tablet TAKE 1 TABLET (10 MG TOTAL) BY MOUTH DAILY. 90 tablet 1  . Cholecalciferol 50000 units TABS Take 1 tablet by mouth once a week. (Patient taking differently: Take 1 capsule by mouth once a week. ) 12 tablet 1  . levothyroxine (SYNTHROID, LEVOTHROID) 125 MCG tablet Take 1 tablet (125 mcg total) by mouth daily. 90 tablet 1  . mometasone (NASONEX) 50 MCG/ACT nasal spray Place 4 sprays into the nose daily. (Patient taking differently: Place 4 sprays into the nose daily as needed. ) 17 g 12  . telmisartan (MICARDIS) 80 MG tablet Take 1 tablet (80 mg total) by mouth daily. 90 tablet 1   No facility-administered medications prior to visit.     PAST MEDICAL HISTORY: Past Medical History:  Diagnosis Date  . Allergy    codeine  . Arthritis    rt knee  . GERD (gastroesophageal reflux disease)   . Hypertension   . Hypothyroidism     PAST SURGICAL HISTORY: Past Surgical History:  Procedure Laterality Date  . ABDOMINAL HYSTERECTOMY  2001  . BLADDER REPAIR  2009   mesh / prolapsed bladder  . CESAREAN SECTION  1995  . CHOLECYSTECTOMY  July 2013    FAMILY HISTORY: Family History  Problem Relation Age of Onset  .  Diabetes Mother   . Heart disease Mother        congestive heart failure  . Asthma Mother   . Hypertension Mother   . Kidney disease Mother   . Cancer Maternal Grandmother 80       stomach  . Cancer Maternal Grandfather 80       lung  . Thyroid disease Other   . Stomach cancer Paternal Grandmother   . Colon cancer Neg Hx   . Pancreatic cancer Neg Hx     SOCIAL HISTORY: Social History   Socioeconomic History  . Marital status: Married    Spouse name: Not on file  . Number of children: Not on file  . Years of education: Not on file  . Highest education level: Not on file  Occupational History  . 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 Financial resource strain: Not  on file  . Food insecurity:    Worry: Not on file    Inability: Not on file  . Transportation needs:    Medical: Not on file    Non-medical: Not on file  Tobacco Use  . Smoking status: Never Smoker  . Smokeless tobacco: Never Used  Substance and Sexual Activity  . Alcohol use: No  . Drug use: No  . Sexual activity: Not on file  Lifestyle  . Physical activity:    Days per week: Not on file    Minutes per session: Not on file  . Stress: Not on file  Relationships  . Social connections:    Talks on phone: Not on file    Gets together: Not on file    Attends religious service: Not on file    Active member of club or organization: Not on file    Attends meetings of clubs or organizations: Not on file    Relationship status: Not on file  . Intimate partner violence:    Fear of current or ex partner: Not on file    Emotionally abused: Not on file    Physically abused: Not on file    Forced sexual activity: Not on file  Other Topics Concern  . Not on file  Social History Narrative   Regular Exercise -  YES           PHYSICAL EXAM  Vitals:   08/24/17 0823  BP: (!) 144/95  Pulse: 81  Weight: 209 lb 12.8 oz (95.2 kg)  Height: 5' 5"  (1.651 m)   Body mass index is  34.91 kg/m.  Generalized: Well developed, in no acute distress  Head: normocephalic and atraumatic,. Oropharynx benign mallopati 5 Neck: Supple, circumference 17 Lungs clear Musculoskeletal: No deformity  Skin no edema in lower extremities Neurological examination   Mentation: Alert oriented to time, place, history taking. Attention span and concentration appropriate. Recent and remote memory intact.  Follows all commands speech and language fluent. ESS 2 Cranial nerve II-XII: Pupils were equal round reactive to light extraocular movements were full, visual field were full on confrontational test. Facial sensation and strength were normal. hearing was intact to finger rubbing bilaterally. Uvula tongue midline. head turning and shoulder shrug were normal and symmetric.Tongue protrusion into cheek strength was normal. Motor: normal bulk and tone, full strength in the BUE, BLE,  Sensory: normal and symmetric to light touch,  Coordination: finger-nose-finger, heel-to-shin bilaterally, no dysmetria Gait and Station: Rising up from seated position without assistance, normal stance,  moderate stride, good arm swing, smooth turning, able to perform tiptoe, and heel walking without difficulty. Tandem gait is steady  DIAGNOSTIC DATA (LABS, IMAGING, TESTING) - I reviewed patient records, labs, notes, testing and imaging myself where available.  Lab Results  Component Value Date   WBC 9.9 03/18/2017   HGB 14.1 03/18/2017   HCT 41.3 03/18/2017   MCV 87.1 03/18/2017   PLT 365.0 03/18/2017      Component Value Date/Time   NA 138 05/04/2017 1339   K 3.9 05/04/2017 1339   CL 102 05/04/2017 1339   CO2 29 05/04/2017 1339   GLUCOSE 113 (H) 05/04/2017 1339   BUN 9 05/04/2017 1339   CREATININE 0.82 05/04/2017 1339   CALCIUM 9.6 05/04/2017 1339   PROT 7.4 03/18/2017 1624   ALBUMIN 4.2 03/18/2017 1624   AST 13 03/18/2017 1624   ALT 22 03/18/2017 1624   ALKPHOS 87 03/18/2017 1624   BILITOT 0.3  03/18/2017 1624  GFRNONAA 78.06 01/08/2010 1327   GFRAA  03/16/2008 0915    >60        The eGFR has been calculated using the MDRD equation. This calculation has not been validated in all clinical situations. eGFR's persistently <60 mL/min signify possible Chronic Kidney Disease.   Lab Results  Component Value Date   CHOL 213 (H) 06/23/2016   HDL 46.30 06/23/2016   LDLCALC 153 (H) 06/11/2015   LDLDIRECT 143.0 06/23/2016   TRIG 223.0 (H) 06/23/2016   CHOLHDL 5 06/23/2016   Lab Results  Component Value Date   HGBA1C 5.8 05/04/2017    Lab Results  Component Value Date   TSH 5.58 (H) 05/04/2017      ASSESSMENT AND PLAN  57 y.o. year old female recently diagnosed with obstructive sleep apnea here for her  compliance.   Data dated 07/24/2017-08/22/2017 shows compliance greater than 4 hours at 87%.  The other 4 days that she did not use the machine she was at the hospital with her dying father-in-law.  Average usage 6 hours 21 minutes.  Set pressure 9 cm.  Leak 95th percentile 9.2.  AHI 0.5 ESS 2.    CPAP compliance 87% greater than 4 hours,  Continue same settings Follow-up yearly Dennie Bible, [facility], [facility], APRN  [facility] [address] [place], [place] [address] [phone]

## 2017-08-24 ENCOUNTER — Ambulatory Visit (INDEPENDENT_AMBULATORY_CARE_PROVIDER_SITE_OTHER): Payer: BLUE CROSS/BLUE SHIELD | Admitting: Nurse Practitioner

## 2017-08-24 ENCOUNTER — Encounter: Payer: Self-pay | Admitting: Nurse Practitioner

## 2017-08-24 VITALS — BP 144/95 | HR 81 | Ht 65.0 in | Wt 209.8 lb

## 2017-08-24 DIAGNOSIS — G4733 Obstructive sleep apnea (adult) (pediatric): Secondary | ICD-10-CM | POA: Diagnosis not present

## 2017-08-24 DIAGNOSIS — Z9989 Dependence on other enabling machines and devices: Secondary | ICD-10-CM

## 2017-08-24 NOTE — Patient Instructions (Signed)
CPAP compliance 87% greater than 4 hours,  Continue same settings Follow-up yearly

## 2017-09-06 DIAGNOSIS — L739 Follicular disorder, unspecified: Secondary | ICD-10-CM | POA: Diagnosis not present

## 2017-09-24 DIAGNOSIS — G4733 Obstructive sleep apnea (adult) (pediatric): Secondary | ICD-10-CM | POA: Diagnosis not present

## 2017-10-04 ENCOUNTER — Other Ambulatory Visit: Payer: Self-pay | Admitting: Internal Medicine

## 2017-11-02 ENCOUNTER — Other Ambulatory Visit: Payer: Self-pay | Admitting: Internal Medicine

## 2017-12-08 ENCOUNTER — Encounter: Payer: Self-pay | Admitting: Internal Medicine

## 2017-12-14 DIAGNOSIS — Z6834 Body mass index (BMI) 34.0-34.9, adult: Secondary | ICD-10-CM | POA: Diagnosis not present

## 2017-12-14 DIAGNOSIS — Z01419 Encounter for gynecological examination (general) (routine) without abnormal findings: Secondary | ICD-10-CM | POA: Diagnosis not present

## 2017-12-14 DIAGNOSIS — Z1231 Encounter for screening mammogram for malignant neoplasm of breast: Secondary | ICD-10-CM | POA: Diagnosis not present

## 2017-12-28 ENCOUNTER — Encounter: Payer: Self-pay | Admitting: Neurology

## 2018-01-23 ENCOUNTER — Encounter: Payer: Self-pay | Admitting: Gastroenterology

## 2018-08-26 ENCOUNTER — Ambulatory Visit: Payer: BLUE CROSS/BLUE SHIELD | Admitting: Nurse Practitioner

## 2018-08-31 DIAGNOSIS — M79672 Pain in left foot: Secondary | ICD-10-CM | POA: Diagnosis not present

## 2018-09-08 ENCOUNTER — Ambulatory Visit (INDEPENDENT_AMBULATORY_CARE_PROVIDER_SITE_OTHER): Payer: BC Managed Care – PPO | Admitting: Internal Medicine

## 2018-09-08 ENCOUNTER — Other Ambulatory Visit: Payer: Self-pay

## 2018-09-08 ENCOUNTER — Encounter: Payer: Self-pay | Admitting: Internal Medicine

## 2018-09-08 ENCOUNTER — Ambulatory Visit (INDEPENDENT_AMBULATORY_CARE_PROVIDER_SITE_OTHER)
Admission: RE | Admit: 2018-09-08 | Discharge: 2018-09-08 | Disposition: A | Discharge status: Home / Self Care | Payer: BC Managed Care – PPO | Source: Ambulatory Visit | Attending: Internal Medicine | Admitting: Internal Medicine

## 2018-09-08 VITALS — BP 136/88 | HR 96 | Temp 98.6°F | Ht 65.0 in | Wt 203.0 lb

## 2018-09-08 DIAGNOSIS — M84375A Stress fracture, left foot, initial encounter for fracture: Secondary | ICD-10-CM | POA: Insufficient documentation

## 2018-09-08 DIAGNOSIS — M79672 Pain in left foot: Secondary | ICD-10-CM | POA: Diagnosis not present

## 2018-09-08 DIAGNOSIS — M7732 Calcaneal spur, left foot: Secondary | ICD-10-CM | POA: Diagnosis not present

## 2018-09-08 MED ORDER — ETODOLAC ER 400 MG PO TB24: 400.0000 mg | ORAL_TABLET | Freq: Every day | ORAL | 0 refills | Status: DC

## 2018-09-08 NOTE — Patient Instructions (Signed)
Stress Fracture  A stress fracture is a small break or crack in a bone. A stress fracture can be fully broken (complete) or partially broken (incomplete). The most common sites for stress fractures are the bones in the front of your feet (metatarsals), your heel (calcaneus), and the long bone of your lower leg (tibia). What are the causes? This condition is caused by overuse or repetitive exercise, such as running. It happens when a bone cannot absorb any more shock because the muscles around it are weak. Stress fractures happen most commonly when:  You rapidly increase or start a new physical activity.  You use shoes that are worn out or do not fit properly.  You exercise on a new surface. What increases the risk? You are more likely to develop this condition if:  You have a condition that causes weak bones (osteoporosis).  You are female. Stress fractures are more likely to occur in women. What are the signs or symptoms? The most common symptom of a stress fracture is feeling pain when you are using or putting weight on the affected part of your body. The pain usually improves when you are resting. Other symptoms may include:  Swelling of the affected area.  Pain in the area when it is touched. Stress fracture pain usually develops over time. How is this diagnosed? This condition may be diagnosed by:  Your symptoms.  Your medical history.  A physical exam.  Imaging tests, such as: ? X-rays. ? MRI. ? Bone scan. How is this treated? Treatment depends on the severity of your stress fracture. It is commonly treated with resting, icing, compression, and elevation (RICE therapy). Treatment may also include:  Medicines to reduce inflammation.  A cast or a walking shoe.  Crutches.  Surgery. This is usually only in severe cases. Follow these instructions at home: If you have a cast:  Do not put pressure on any part of the cast until it is fully hardened. This may take  several hours.  Do not stick anything inside the cast to scratch your skin. Doing that increases your risk of infection.  Check the skin around the cast every day. Tell your health care provider about any concerns.  You may put lotion on dry skin around the edges of the cast. Do not put lotion on the skin underneath the cast.  Keep the cast clean.  If the cast is not waterproof: ? Do not let it get wet. ? Cover it with a watertight covering when you take a bath or a shower. If you have a walking shoe:  Wear the shoe as told by your health care provider. Remove it only as told by your health care provider.  Loosen the shoe if your toes tingle, become numb, or turn cold and blue.  Keep the shoe clean.  If the shoe is not waterproof: ? Do not let it get wet. Managing pain, stiffness, and swelling   If directed, apply ice to the injured area: ? If you have a walking shoe, remove the shoe as told by your health care provider. ? Put ice in a plastic bag. ? Place a towel between your skin and the bag or between your cast and the bag. ? Leave the ice on for 20 minutes, 2-3 times per day.  Move your toes often to avoid stiffness and to lessen swelling.  Raise (elevate) the injured area above the level of your heart while you are sitting or lying down. Activity  Rest  as directed by your health care provider. Ask your health care provider if you may do alternative exercises, such as swimming or biking, while you are healing.  Return to your normal activities as directed by your health care provider. Ask your health care provider what activities are safe for you.  Perform range-of-motion exercises only as directed by your health care provider. General instructions  Do not use the injured limb to support yourbody weight until your health care provider says that you can. Use crutches if your health care provider tells you to do so.  Do not use any products that contain nicotine or  tobacco, such as cigarettes and e-cigarettes. These can delay bone healing. If you need help quitting, ask your health care provider.  Take over-the-counter and prescription medicines only as told by your health care provider.  Keep all follow-up visits as told by your health care provider. This is important. How is this prevented?  Only wear shoes that: ? Fit well. ? Are not worn out.  Eat a healthy diet that contains vitamin D and calcium. This helps keep your bones strong. Good sources of calcium and vitamin D include: ? Low-fat dairy products such as milk, yogurt, and cheese. ? Certain fish, such as fresh or canned salmon, tuna, and sardines. ? Products that have calcium and vitamin D added to them (fortified products), such as fortified cereals or juice.  Be careful when you start a new physical activity. Give your body time to adjust.  Avoid doing only one kind of activity. Do different exercises, such as swimming and running, so that no single part of your body gets overused.  Do strength-training exercises. Contact a health care provider if:  Your pain gets worse.  You have new symptoms.  You have increased swelling. Get help right away if:  You lose feeling in the injured area. Summary  A stress fracture is a small break or crack in a bone. A stress fracture can be fully broken (complete) or partially broken (incomplete).  This condition is caused by overuse or repetitive exercise, such as running.  The most common symptom of a stress fracture is feeling pain when you are using or putting weight on the affected part of your body.  Treatment depends on the severity of your stress fracture. This information is not intended to replace advice given to you by your health care provider. Make sure you discuss any questions you have with your health care provider. Document Released: 03/28/2002 Document Revised: 02/16/2017 Document Reviewed: 02/16/2017 Elsevier Patient  Education  2020 [employer].

## 2018-09-08 NOTE — Progress Notes (Signed)
Subjective:  Patient ID: Cynthia Craig, female    DOB: 15-Jan-1961  Age: 58 y.o. MRN: [id number]  CC: Foot Pain   HPI Cynthia Craig presents for f/up - She complains of a 3-week history of nontraumatic left foot pain.  For several months prior to this she has been engaging in a long distance walking program.  She is not getting much symptom relief with over-the-counter doses of ibuprofen.  Outpatient Medications Prior to Visit  Medication Sig Dispense Refill  . Cholecalciferol 50000 units TABS Take 1 tablet by mouth once a week. (Patient taking differently: Take 1 capsule by mouth once a week. ) 12 tablet 1  . hydrochlorothiazide (HYDRODIURIL) 25 MG tablet Take 25 mg by mouth daily.    Marland Kitchen telmisartan (MICARDIS) 80 MG tablet Take 1 tablet (80 mg total) by mouth daily. 90 tablet 1  . albuterol (PROVENTIL HFA;VENTOLIN HFA) 108 (90 BASE) MCG/ACT inhaler Inhale 2 puffs into the lungs every 6 (six) hours as needed for wheezing or shortness of breath. 1 Inhaler 1  . cetirizine (ZYRTEC) 10 MG tablet TAKE 1 TABLET (10 MG TOTAL) BY MOUTH DAILY. 90 tablet 1  . levothyroxine (SYNTHROID, LEVOTHROID) 125 MCG tablet Take 1 tablet (125 mcg total) by mouth daily. 90 tablet 1  . mometasone (NASONEX) 50 MCG/ACT nasal spray Place 4 sprays into the nose daily. (Patient taking differently: Place 4 sprays into the nose daily as needed. ) 17 g 12  . ZORVOLEX 18 MG CAPS TAKE 1 CAPSULE BY MOUTH THREE TIMES A DAY WITH MEALS AS NEEDED 90 capsule 3   No facility-administered medications prior to visit.     ROS Review of Systems  All other systems reviewed and are negative.   Objective:  BP 136/88 (BP Location: Left Arm, Patient Position: Sitting, Cuff Size: Large)   Pulse 96   Temp 98.6 F (37 C) (Oral)   Ht 5\' 5"  (1.651 m)   Wt 203 lb (92.1 kg)   SpO2 98%   BMI 33.78 kg/m   BP Readings from Last 3 Encounters:  09/08/18 136/88  08/24/17 (!) 144/95  05/05/17 (!) 146/98    Wt Readings from Last 3  Encounters:  09/08/18 203 lb (92.1 kg)  08/24/17 209 lb 12.8 oz (95.2 kg)  05/05/17 211 lb 9.6 oz (96 kg)    Physical Exam Musculoskeletal:     Left foot: Normal range of motion. Tenderness, bony tenderness and swelling present. No crepitus or deformity.       Feet:     Lab Results  Component Value Date   WBC 9.9 03/18/2017   HGB 14.1 03/18/2017   HCT 41.3 03/18/2017   PLT 365.0 03/18/2017   GLUCOSE 113 (H) 05/04/2017   CHOL 213 (H) 06/23/2016   TRIG 223.0 (H) 06/23/2016   HDL 46.30 06/23/2016   LDLDIRECT 143.0 06/23/2016   LDLCALC 153 (H) 06/11/2015   ALT 22 03/18/2017   AST 13 03/18/2017   NA 138 05/04/2017   K 3.9 05/04/2017   CL 102 05/04/2017   CREATININE 0.82 05/04/2017   BUN 9 05/04/2017   CO2 29 05/04/2017   TSH 5.58 (H) 05/04/2017   HGBA1C 5.8 05/04/2017    Mr Lumbar Spine Wo Contrast  Result Date: 05/23/2017 CLINICAL DATA:  Degenerative disc disease. Lumbar radiculitis. Back pain for greater than 6 weeks despite conservative treatment. Low back pain extending into the left hip for 6-8 months. EXAM: MRI LUMBAR SPINE WITHOUT CONTRAST TECHNIQUE: Multiplanar, multisequence MR imaging of  the lumbar spine was performed. No intravenous contrast was administered. COMPARISON:  Lumbar spine radiographs 10/28/2015 FINDINGS: Segmentation: 5 non rib-bearing lumbar type vertebral bodies are present. The lowest fully formed vertebral body is labeled L5. Alignment: Slight anterolisthesis is again noted at L4-5. AP alignment is otherwise anatomic. Vertebrae: Two hemangiomas are present near the inferior endplate of L1. Marrow signal and vertebral body heights are otherwise normal. Conus medullaris and cauda equina: Conus extends to the level. Conus and cauda equina appear normal. Paraspinal and other soft tissues: Limited imaging of the abdomen is unremarkable. No significant adenopathy is present. Disc levels: L1-2: Negative. L2-3: Negative. L3-4: Mild leftward disc bulging is  present without significant stenosis. L4-5: There is uncovering of a leftward disc protrusion. Moderate facet hypertrophy is worse on the left. This potentially irritates the left L4 or L5 nerve roots without focal compressive stenosis. L5-S1: Negative. IMPRESSION: 1. Shallow leftward disc protrusion and asymmetric left-sided facet hypertrophy at L4-5 with slight anterolisthesis potentially impacts the left L4 or L5 nerve roots without focal compressive stenosis. 2. Mild leftward disc bulging at L3-4 without significant stenosis. Electronically Signed   By: San Morelle M.D.   On: 05/23/2017 06:56    No results found.  Assessment & Plan:   Cynthia Craig was seen today for foot pain.  Diagnoses and all orders for this visit:  Acute foot pain, left -     DG Foot Complete Left; Future -     MR FOOT LEFT WO CONTRAST; Future -     etodolac (LODINE XL) 400 MG 24 hr tablet; Take 1 tablet (400 mg total) by mouth daily.  Mechanical foot pain, left -     MR FOOT LEFT WO CONTRAST; Future -     etodolac (LODINE XL) 400 MG 24 hr tablet; Take 1 tablet (400 mg total) by mouth daily.  Stress fracture of metatarsal bone of left foot, initial encounter- This is the working diagnosis.  I have asked her to undergo an MRI to confirm the presence or absence of a stress fracture.  If the MRI is positive for stress fracture then I will refer her to orthopedics to be placed in an orthotic. -     MR FOOT LEFT WO CONTRAST; Future -     etodolac (LODINE XL) 400 MG 24 hr tablet; Take 1 tablet (400 mg total) by mouth daily.   I have discontinued Cynthia Craig. Cynthia Craig's albuterol, mometasone, levothyroxine, cetirizine, and Zorvolex. I am also having her start on etodolac. Additionally, I am having her maintain her telmisartan, Cholecalciferol, and hydrochlorothiazide.  Meds ordered this encounter  Medications  . etodolac (LODINE XL) 400 MG 24 hr tablet    Sig: Take 1 tablet (400 mg total) by mouth daily.    Dispense:  90  tablet    Refill:  0     Follow-up: Return in about 4 weeks (around 10/06/2018).  Cynthia Calico, MD

## 2018-09-13 ENCOUNTER — Other Ambulatory Visit: Payer: Self-pay

## 2018-09-13 ENCOUNTER — Ambulatory Visit
Admission: RE | Admit: 2018-09-13 | Discharge: 2018-09-13 | Disposition: A | Discharge status: Home / Self Care | Payer: BC Managed Care – PPO | Source: Ambulatory Visit | Attending: Internal Medicine | Admitting: Internal Medicine

## 2018-09-13 DIAGNOSIS — M79672 Pain in left foot: Secondary | ICD-10-CM

## 2018-09-13 DIAGNOSIS — M84375A Stress fracture, left foot, initial encounter for fracture: Secondary | ICD-10-CM

## 2018-09-13 DIAGNOSIS — M19072 Primary osteoarthritis, left ankle and foot: Secondary | ICD-10-CM | POA: Diagnosis not present

## 2018-09-14 ENCOUNTER — Other Ambulatory Visit: Payer: Self-pay | Admitting: Internal Medicine

## 2018-09-14 ENCOUNTER — Encounter: Payer: Self-pay | Admitting: Internal Medicine

## 2018-09-14 DIAGNOSIS — M19072 Primary osteoarthritis, left ankle and foot: Secondary | ICD-10-CM | POA: Insufficient documentation

## 2018-09-14 DIAGNOSIS — G4733 Obstructive sleep apnea (adult) (pediatric): Secondary | ICD-10-CM | POA: Diagnosis not present

## 2018-09-14 MED ORDER — METHYLPREDNISOLONE 4 MG PO TBPK: ORAL_TABLET | ORAL | 0 refills | Status: AC

## 2018-11-01 ENCOUNTER — Other Ambulatory Visit: Payer: Self-pay

## 2018-11-01 DIAGNOSIS — Z20822 Contact with and (suspected) exposure to covid-19: Secondary | ICD-10-CM

## 2018-11-01 DIAGNOSIS — Z20828 Contact with and (suspected) exposure to other viral communicable diseases: Secondary | ICD-10-CM | POA: Diagnosis not present

## 2018-11-03 LAB — NOVEL CORONAVIRUS, NAA: SARS-CoV-2, NAA: NOT DETECTED

## 2018-11-08 ENCOUNTER — Other Ambulatory Visit: Payer: Self-pay

## 2018-11-08 DIAGNOSIS — Z20822 Contact with and (suspected) exposure to covid-19: Secondary | ICD-10-CM

## 2018-11-10 LAB — NOVEL CORONAVIRUS, NAA: SARS-CoV-2, NAA: NOT DETECTED

## 2018-12-22 DIAGNOSIS — Z01419 Encounter for gynecological examination (general) (routine) without abnormal findings: Secondary | ICD-10-CM | POA: Diagnosis not present

## 2018-12-22 DIAGNOSIS — Z6835 Body mass index (BMI) 35.0-35.9, adult: Secondary | ICD-10-CM | POA: Diagnosis not present

## 2018-12-22 DIAGNOSIS — Z13820 Encounter for screening for osteoporosis: Secondary | ICD-10-CM | POA: Diagnosis not present

## 2018-12-22 DIAGNOSIS — Z1231 Encounter for screening mammogram for malignant neoplasm of breast: Secondary | ICD-10-CM | POA: Diagnosis not present

## 2019-01-10 ENCOUNTER — Other Ambulatory Visit: Payer: Self-pay | Admitting: Internal Medicine

## 2019-01-10 DIAGNOSIS — M79672 Pain in left foot: Secondary | ICD-10-CM

## 2019-01-10 DIAGNOSIS — M84375A Stress fracture, left foot, initial encounter for fracture: Secondary | ICD-10-CM

## 2019-02-09 ENCOUNTER — Ambulatory Visit: Payer: BC Managed Care – PPO | Attending: Internal Medicine

## 2019-02-09 DIAGNOSIS — Z20822 Contact with and (suspected) exposure to covid-19: Secondary | ICD-10-CM | POA: Diagnosis not present

## 2019-02-10 LAB — NOVEL CORONAVIRUS, NAA: SARS-CoV-2, NAA: NOT DETECTED

## 2019-05-08 ENCOUNTER — Other Ambulatory Visit: Payer: Self-pay | Admitting: Internal Medicine

## 2019-05-08 ENCOUNTER — Telehealth: Payer: Self-pay

## 2019-05-08 DIAGNOSIS — K635 Polyp of colon: Secondary | ICD-10-CM

## 2019-05-08 NOTE — Telephone Encounter (Signed)
New message    Need a referral to GI.   Reason:  Colonoscopy.

## 2019-05-09 ENCOUNTER — Encounter: Payer: Self-pay | Admitting: Gastroenterology

## 2019-06-07 ENCOUNTER — Other Ambulatory Visit: Payer: Self-pay

## 2019-06-07 ENCOUNTER — Ambulatory Visit (AMBULATORY_SURGERY_CENTER): Payer: Self-pay | Admitting: *Deleted

## 2019-06-07 VITALS — Temp 98.2°F | Ht 65.0 in | Wt 214.0 lb

## 2019-06-07 DIAGNOSIS — Z86010 Personal history of colonic polyps: Secondary | ICD-10-CM

## 2019-06-07 NOTE — Progress Notes (Signed)

## 2019-06-09 ENCOUNTER — Encounter: Payer: Self-pay | Admitting: Gastroenterology

## 2019-06-21 ENCOUNTER — Ambulatory Visit (AMBULATORY_SURGERY_CENTER): Payer: BC Managed Care – PPO | Admitting: Gastroenterology

## 2019-06-21 ENCOUNTER — Other Ambulatory Visit: Payer: Self-pay | Admitting: Gastroenterology

## 2019-06-21 ENCOUNTER — Encounter: Payer: Self-pay | Admitting: Gastroenterology

## 2019-06-21 ENCOUNTER — Other Ambulatory Visit: Payer: Self-pay

## 2019-06-21 VITALS — BP 124/76 | HR 77 | Temp 97.3°F | Resp 21 | Wt 214.0 lb

## 2019-06-21 DIAGNOSIS — D128 Benign neoplasm of rectum: Secondary | ICD-10-CM

## 2019-06-21 DIAGNOSIS — Z86010 Personal history of colonic polyps: Secondary | ICD-10-CM | POA: Diagnosis not present

## 2019-06-21 DIAGNOSIS — Z1211 Encounter for screening for malignant neoplasm of colon: Secondary | ICD-10-CM | POA: Diagnosis not present

## 2019-06-21 LAB — HM COLONOSCOPY

## 2019-06-21 MED ORDER — SODIUM CHLORIDE 0.9 % IV SOLN: 500.0000 mL | Freq: Once | INTRAVENOUS | Status: DC

## 2019-06-21 NOTE — Op Note (Signed)
[place] Patient Name: Cynthia Craig Procedure Date: 06/21/2019 1:28 PM MRN: [id number] Endoscopist: Milus Banister , MD Age: 59 Referring MD:  Date of Birth: February 08, 1960 Gender: Female Account #: [id number] Procedure:                Colonoscopy Indications:              High risk colon cancer surveillance: Personal                            history of colonic polyps; Colonoscopy 01/2013                            single subCM adenoma removedc Medicines:                Monitored Anesthesia Care Procedure:                Pre-Anesthesia Assessment:                           - Prior to the procedure, a History and Physical                            was performed, and patient medications and                            allergies were reviewed. The patient's tolerance of                            previous anesthesia was also reviewed. The risks                            and benefits of the procedure and the sedation                            options and risks were discussed with the patient.                            All questions were answered, and informed consent                            was obtained. Prior Anticoagulants: The patient has                            taken no previous anticoagulant or antiplatelet                            agents. ASA Grade Assessment: II - A patient with                            mild systemic disease. After reviewing the risks                            and benefits, the patient was deemed in  satisfactory condition to undergo the procedure.                           After obtaining informed consent, the colonoscope                            was passed under direct vision. Throughout the                            procedure, the patient's blood pressure, pulse, and                            oxygen saturations were monitored continuously. The                            Colonoscope was introduced through the  anus and                            advanced to the the cecum, identified by                            appendiceal orifice and ileocecal valve. The                            colonoscopy was performed without difficulty. The                            patient tolerated the procedure well. The quality                            of the bowel preparation was good. The ileocecal                            valve, appendiceal orifice, and rectum were                            photographed. Scope In: 1:48:42 PM Scope Out: 2:00:19 PM Scope Withdrawal Time: 0 hours 8 minutes 24 seconds  Total Procedure Duration: 0 hours 11 minutes 37 seconds  Findings:                 Two sessile polyps were found in the rectum. The                            polyps were 2 to 4 mm in size. These polyps were                            removed with a cold snare. Resection and retrieval                            were complete.                           Multiple small and large-mouthed diverticula were  found in the left colon.                           The exam was otherwise without abnormality on                            direct and retroflexion views. Complications:            No immediate complications. Estimated blood loss:                            None. Estimated Blood Loss:     Estimated blood loss: none. Impression:               - Two 2 to 4 mm polyps in the rectum, removed with                            a cold snare. Resected and retrieved.                           - Diverticulosis in the left colon.                           - The examination was otherwise normal on direct                            and retroflexion views. Recommendation:           - Patient has a contact number available for                            emergencies. The signs and symptoms of potential                            delayed complications were discussed with the                             patient. Return to normal activities tomorrow.                            Written discharge instructions were provided to the                            patient.                           - Resume previous diet.                           - Continue present medications.                           - Await pathology results. Milus Banister, MD 06/21/2019 2:03:11 PM This report has been signed electronically.

## 2019-06-21 NOTE — Progress Notes (Signed)
Called to room to assist during endoscopic procedure.  Patient ID and intended procedure confirmed with present staff. Received instructions for my participation in the procedure from the performing physician.  

## 2019-06-21 NOTE — Progress Notes (Signed)
Pt's states no medical or surgical changes since previsit or office visit. 

## 2019-06-21 NOTE — Patient Instructions (Signed)
Thank you for allowing [place] to care for you today!  Await pathology results of polyps removed, approximately 7-10 days by mail. Recommendation will be made at that time for next colonoscopy.  Resume previous diet and medications today.  Return to your normal activities tomorrow.    YOU HAD AN ENDOSCOPIC PROCEDURE TODAY AT [facility]:   Refer to the procedure report that was given to you for any specific questions about what was found during the examination.  If the procedure report does not answer your questions, please call your gastroenterologist to clarify.  If you requested that your care partner not be given the details of your procedure findings, then the procedure report has been included in a sealed envelope for you to review at your convenience later.  YOU SHOULD EXPECT: Some feelings of bloating in the abdomen. Passage of more gas than usual.  Walking can help get rid of the air that was put into your GI tract during the procedure and reduce the bloating. If you had a lower endoscopy (such as a colonoscopy or flexible sigmoidoscopy) you may notice spotting of blood in your stool or on the toilet paper. If you underwent a bowel prep for your procedure, you may not have a normal bowel movement for a few days.  Please Note:  You might notice some irritation and congestion in your nose or some drainage.  This is from the oxygen used during your procedure.  There is no need for concern and it should clear up in a day or so.  SYMPTOMS TO REPORT IMMEDIATELY:   Following lower endoscopy (colonoscopy or flexible sigmoidoscopy):  Excessive amounts of blood in the stool  Significant tenderness or worsening of abdominal pains  Swelling of the abdomen that is new, acute  Fever of 100F or higher    For urgent or emergent issues, a gastroenterologist can be reached at any hour by calling [phone]. Do not use MyChart messaging for urgent concerns.    DIET:  We do  recommend a small meal at first, but then you may proceed to your regular diet.  Drink plenty of fluids but you should avoid alcoholic beverages for 24 hours.  ACTIVITY:  You should plan to take it easy for the rest of today and you should NOT DRIVE or use heavy machinery until tomorrow (because of the sedation medicines used during the test).    FOLLOW UP: Our staff will call the number listed on your records 48-72 hours following your procedure to check on you and address any questions or concerns that you may have regarding the information given to you following your procedure. If we do not reach you, we will leave a message.  We will attempt to reach you two times.  During this call, we will ask if you have developed any symptoms of COVID 19. If you develop any symptoms (ie: fever, flu-like symptoms, shortness of breath, cough etc.) before then, please call [phone].  If you test positive for Covid 19 in the 2 weeks post procedure, please call and report this information to [place].    If any biopsies were taken you will be contacted by phone or by letter within the next 1-3 weeks.  Please call us at [phone] if you have not heard about the biopsies in 3 weeks.    SIGNATURES/CONFIDENTIALITY: You and/or your care partner have signed paperwork which will be entered into your electronic medical record.  These signatures attest to the fact  that that the information above on your After Visit Summary has been reviewed and is understood.  Full responsibility of the confidentiality of this discharge information lies with you and/or your care-partner.

## 2019-06-21 NOTE — Progress Notes (Signed)
Report to PACU, RN, vss, BBS= Clear.  

## 2019-06-23 ENCOUNTER — Telehealth: Payer: Self-pay | Admitting: *Deleted

## 2019-06-23 NOTE — Telephone Encounter (Signed)
  Follow up Call-  Call back number 06/21/2019  Post procedure Call Back phone  # [phone]  Permission to leave phone message Yes  Some recent data might be hidden     Patient questions:  Do you have a fever, pain , or abdominal swelling? No. Pain Score  0 *  Have you tolerated food without any problems? Yes.    Have you been able to return to your normal activities? Yes.    Do you have any questions about your discharge instructions: Diet   No. Medications  No. Follow up visit  No.  Do you have questions or concerns about your Care? No.  Actions: * If pain score is 4 or above: No action needed, pain <4.  1. Have you developed a fever since your procedure? no  2.   Have you had an respiratory symptoms (SOB or cough) since your procedure? no  3.   Have you tested positive for COVID 19 since your procedure no  4.   Have you had any family members/close contacts diagnosed with the COVID 19 since your procedure?  no   If yes to any of these questions please route to Joylene John, RN and Erenest Rasher, RN

## 2019-06-26 DIAGNOSIS — E039 Hypothyroidism, unspecified: Secondary | ICD-10-CM | POA: Diagnosis not present

## 2019-09-04 DIAGNOSIS — Z20822 Contact with and (suspected) exposure to covid-19: Secondary | ICD-10-CM | POA: Diagnosis not present

## 2019-12-22 IMAGING — MR MRI OF THE LEFT FOOT WITHOUT CONTRAST
4 of 5 series · 18 of 40 positions shown · non-contrast
Comparison: Left foot x-ray 09/08/2018

CLINICAL DATA: Dorsal left foot pain for 3-4 weeks. No known
injury.

EXAM:
MRI OF THE LEFT FOOT WITHOUT CONTRAST
TECHNIQUE: Multiplanar, multisequence MR imaging of the left forefoot was
performed. No intravenous contrast was administered.

[Series 4: T1 · coronal · 3.0mm · 0.19mm/px · 3 of 44 slices shown (1 of 2)]
[im 5/44]
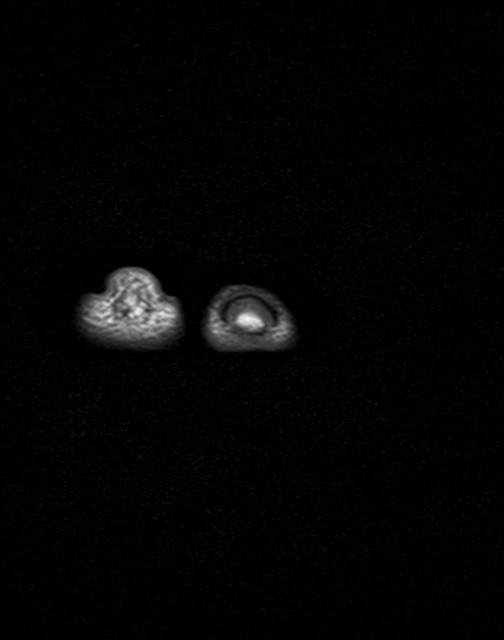
[im 24/44]
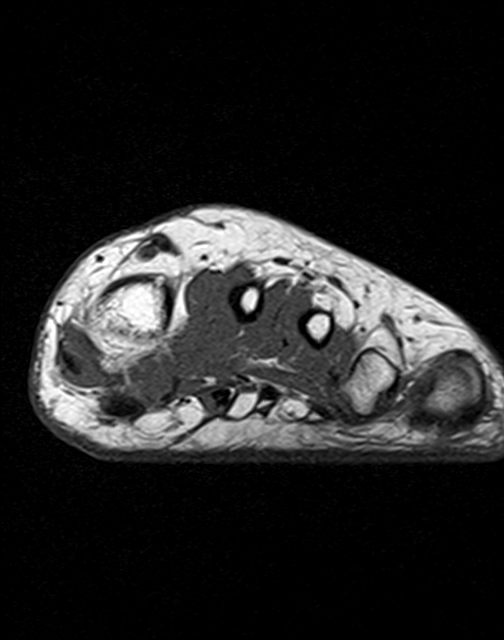
[im 39/44]
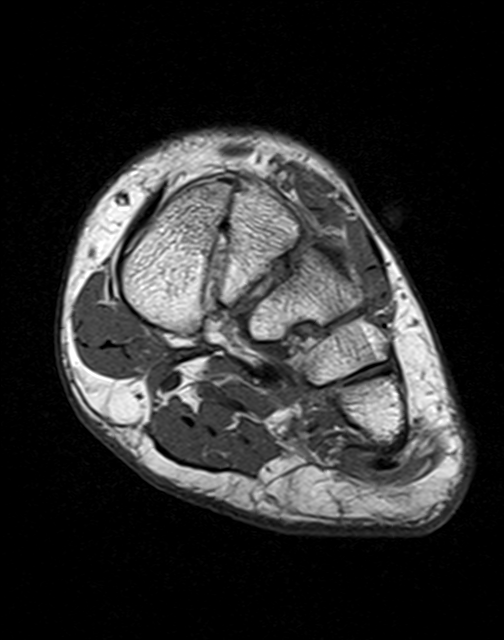

[Series 5: T2 fat-sat · coronal · 3.0mm · 0.19mm/px · 9 of 44 slices shown (1 of 2)]
[im 1/44]
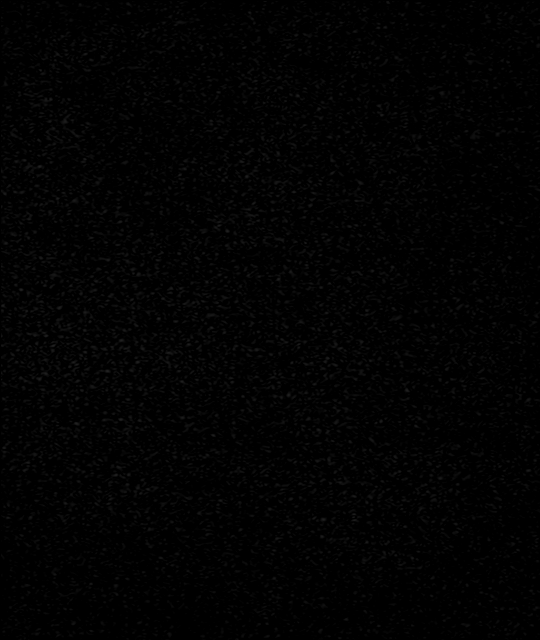
[im 5/44]
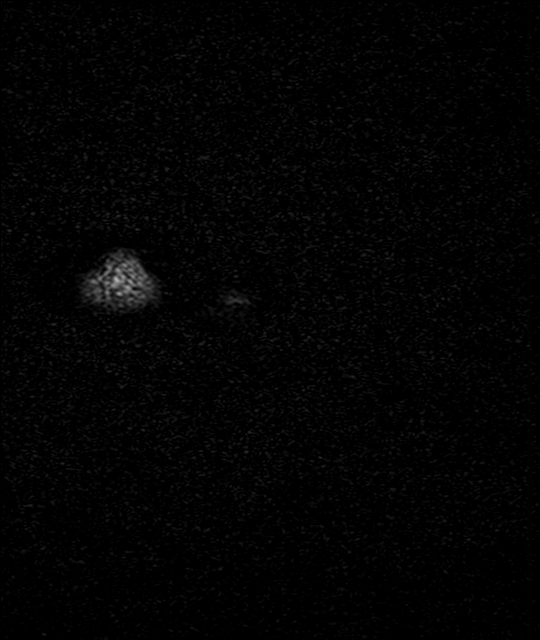
[im 9/44]
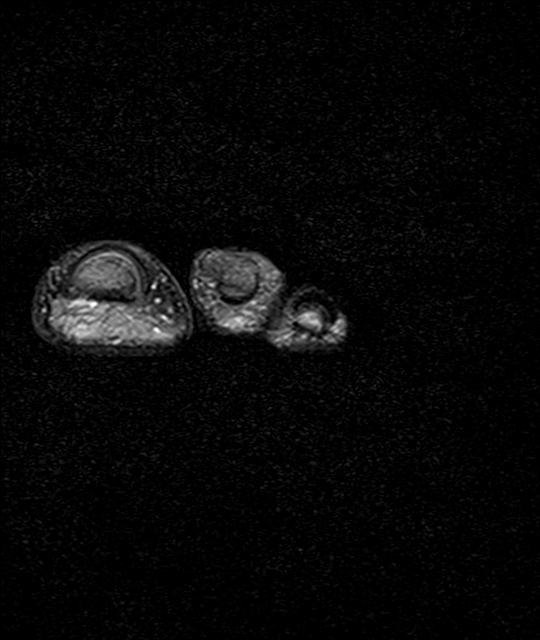
[im 13/44]
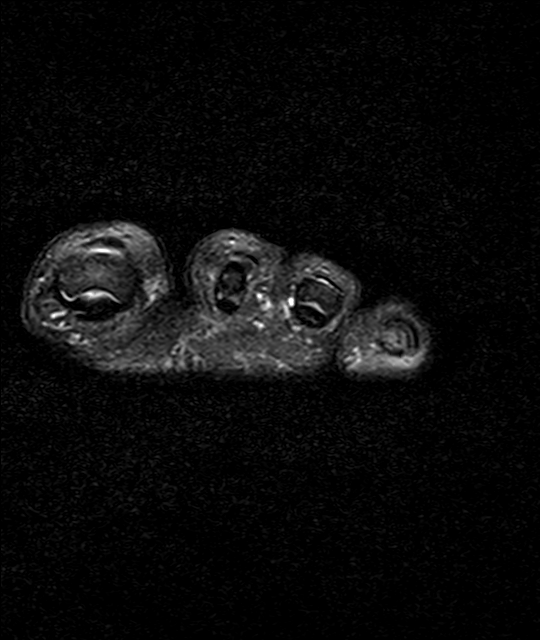
[im 18/44]
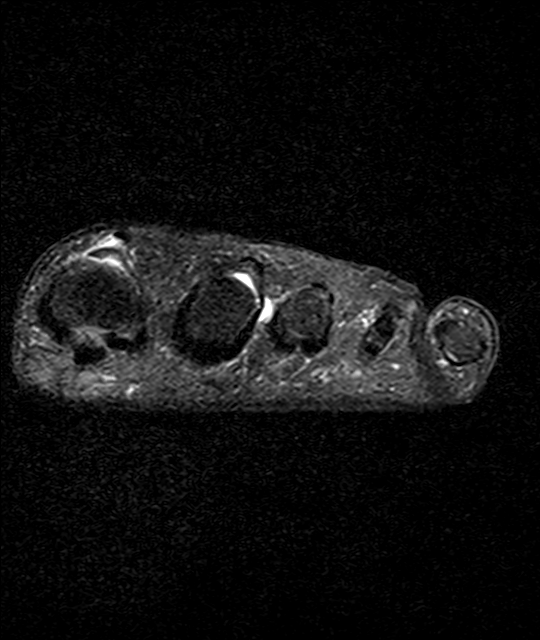
[im 22/44]
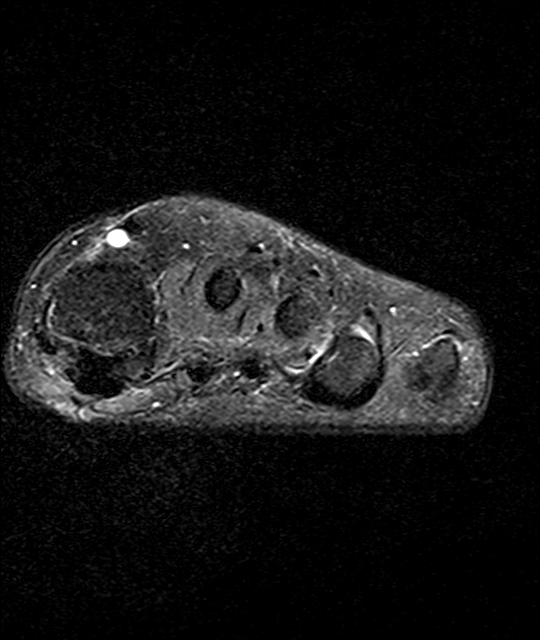
[im 26/44]
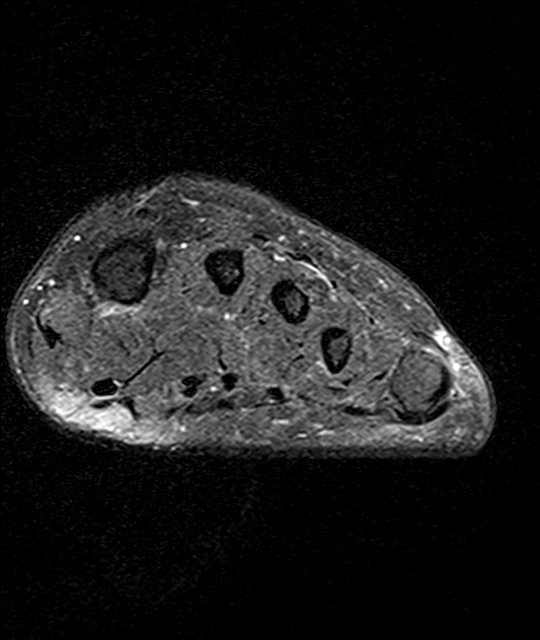
[im 31/44]
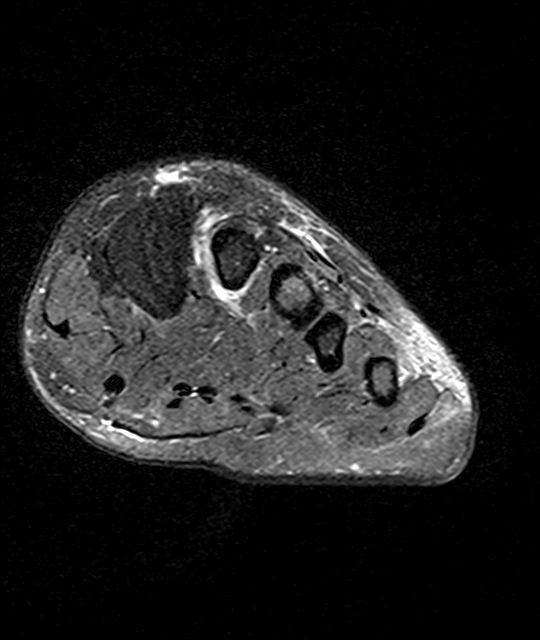
[im 39/44]
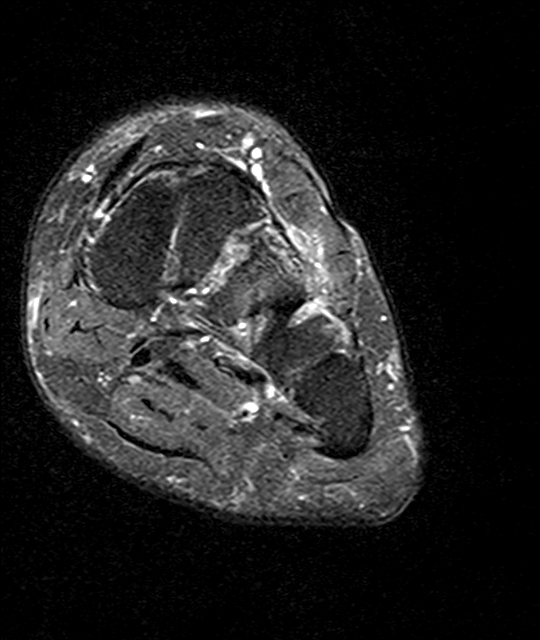

[Series 6: T2 fat-sat · axial · 3.0mm · 0.35mm/px · z∈[-73,-8]mm · 3 of 22 slices shown (2 of 2)]
[im 5/22]
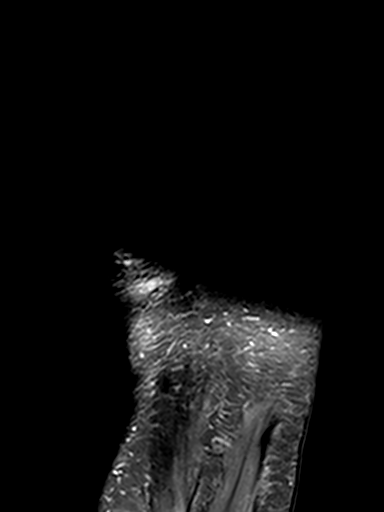
[im 13/22]
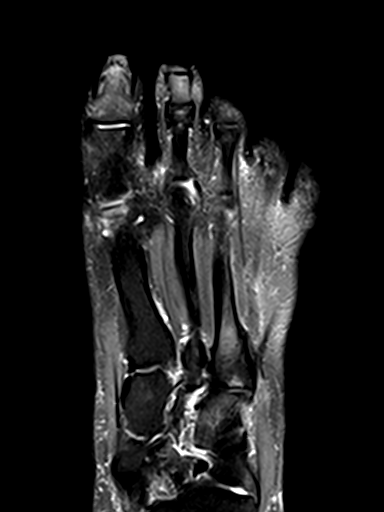
[im 22/22]
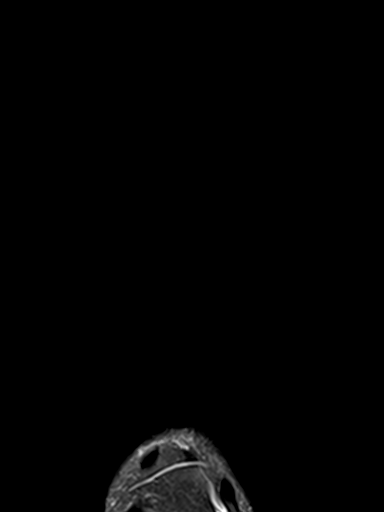

[Series 7: T1 · axial · 3.0mm · 0.35mm/px · z∈[-73,-8]mm · 3 of 22 slices shown (2 of 2)]
[im 5/22]
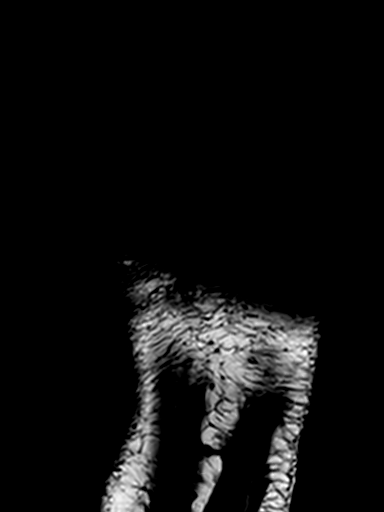
[im 13/22]
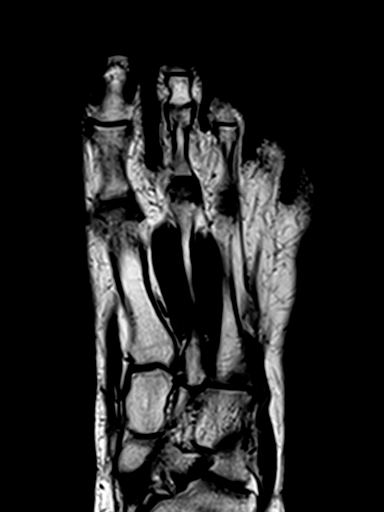
[im 22/22]
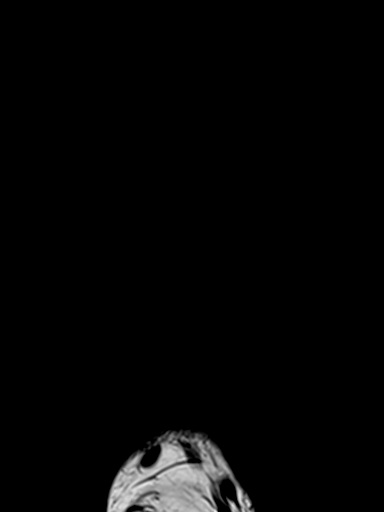

[18 of 40 positions shown; findings below may reference images not displayed]

FINDINGS: Bones/Joint/Cartilage

There are moderate degenerative changes at the second and third
tarsometatarsal joints with joint space loss, mild dorsal
hypertrophy, subchondral cystic changes, and subchondral marrow
edema. No fracture is seen. Minimal degenerative changes at the
first MTP joint. The remaining joint spaces of the forefoot are
maintained. No malalignment. No marrow replacing process.

Ligaments

Intrinsic foot ligaments including the Lisfranc ligament are intact.

Muscles and Tendons

Normal bulk and signal of the foot musculature without atrophy or
fatty infiltration. Flexor and extensor tendons are intact without
tenosynovitis.

Soft tissues

Mild soft tissue edema dorsal to the second and third TMT joints. No
soft tissue fluid collection.
IMPRESSION: 1. Moderate degenerative changes with associated marrow edema
involving the second and third TMT joints. There is mild this edema
within the soft tissues dorsal to these joints.
2. No acute abnormality within the remaining forefoot.

## 2022-03-05 ENCOUNTER — Other Ambulatory Visit: Payer: Self-pay | Admitting: Obstetrics and Gynecology

## 2022-03-05 DIAGNOSIS — E041 Nontoxic single thyroid nodule: Secondary | ICD-10-CM

## 2022-04-01 ENCOUNTER — Ambulatory Visit
Admission: RE | Admit: 2022-04-01 | Discharge: 2022-04-01 | Disposition: A | Discharge status: Home / Self Care | Payer: 59 | Source: Ambulatory Visit | Attending: Obstetrics and Gynecology | Admitting: Obstetrics and Gynecology

## 2022-04-01 DIAGNOSIS — E041 Nontoxic single thyroid nodule: Secondary | ICD-10-CM

## 2023-04-26 ENCOUNTER — Encounter: Payer: Self-pay | Admitting: Internal Medicine
